# Patient Record
Sex: Female | Born: 1935 | Race: White | Hispanic: No | Smoking: Former smoker
Health system: Southern US, Community
[De-identification: ages and names within clinical notes are randomized; demographics above are authoritative.]

## PROBLEM LIST (undated history)

## (undated) DIAGNOSIS — I5022 Chronic systolic (congestive) heart failure: Secondary | ICD-10-CM

## (undated) DIAGNOSIS — E785 Hyperlipidemia, unspecified: Secondary | ICD-10-CM

## (undated) DIAGNOSIS — E079 Disorder of thyroid, unspecified: Secondary | ICD-10-CM

## (undated) DIAGNOSIS — C569 Malignant neoplasm of unspecified ovary: Secondary | ICD-10-CM

## (undated) DIAGNOSIS — I482 Chronic atrial fibrillation, unspecified: Secondary | ICD-10-CM

## (undated) DIAGNOSIS — Z8673 Personal history of transient ischemic attack (TIA), and cerebral infarction without residual deficits: Secondary | ICD-10-CM

## (undated) DIAGNOSIS — R011 Cardiac murmur, unspecified: Secondary | ICD-10-CM

## (undated) HISTORY — PX: BREAST BIOPSY: SHX20

## (undated) HISTORY — DX: Disorder of thyroid, unspecified: E07.9

## (undated) HISTORY — DX: Chronic systolic (congestive) heart failure: I50.22

## (undated) HISTORY — DX: Malignant neoplasm of unspecified ovary: C56.9

## (undated) HISTORY — DX: Hyperlipidemia, unspecified: E78.5

## (undated) HISTORY — DX: Cardiac murmur, unspecified: R01.1

## (undated) HISTORY — PX: ABDOMINAL HYSTERECTOMY: SHX81

## (undated) HISTORY — PX: APPENDECTOMY: SHX54

## (undated) HISTORY — DX: Chronic atrial fibrillation, unspecified: I48.20

## (undated) HISTORY — DX: Personal history of transient ischemic attack (TIA), and cerebral infarction without residual deficits: Z86.73

## (undated) HISTORY — PX: LAPAROSCOPY: SHX197

---

## 2008-06-27 ENCOUNTER — Ambulatory Visit: Payer: Self-pay | Admitting: Unknown Physician Specialty

## 2010-01-30 ENCOUNTER — Ambulatory Visit: Payer: Self-pay | Admitting: Gynecologic Oncology

## 2010-02-13 ENCOUNTER — Ambulatory Visit: Payer: Self-pay | Admitting: Gynecologic Oncology

## 2010-02-23 ENCOUNTER — Ambulatory Visit: Payer: Self-pay | Admitting: Gynecologic Oncology

## 2010-03-02 ENCOUNTER — Ambulatory Visit: Payer: Self-pay | Admitting: Gynecologic Oncology

## 2010-03-06 ENCOUNTER — Inpatient Hospital Stay: Payer: Self-pay | Admitting: Gynecologic Oncology

## 2010-04-01 ENCOUNTER — Ambulatory Visit: Payer: Self-pay | Admitting: Gynecologic Oncology

## 2010-04-03 ENCOUNTER — Ambulatory Visit: Payer: Self-pay | Admitting: Gynecologic Oncology

## 2010-04-09 ENCOUNTER — Ambulatory Visit: Payer: Self-pay | Admitting: General Surgery

## 2010-05-02 ENCOUNTER — Ambulatory Visit: Payer: Self-pay | Admitting: Gynecologic Oncology

## 2010-06-01 ENCOUNTER — Ambulatory Visit: Payer: Self-pay | Admitting: Gynecologic Oncology

## 2010-06-17 LAB — CA 125: CA 125: 30.7 U/mL (ref 0.0–34.0)

## 2010-07-02 ENCOUNTER — Ambulatory Visit: Payer: Self-pay | Admitting: Gynecologic Oncology

## 2010-07-08 LAB — CA 125: CA 125: 22.5 U/mL (ref 0.0–34.0)

## 2010-08-02 ENCOUNTER — Ambulatory Visit: Payer: Self-pay | Admitting: Gynecologic Oncology

## 2010-08-03 LAB — CA 125: CA 125: 21.6 U/mL (ref 0.0–34.0)

## 2010-09-01 ENCOUNTER — Ambulatory Visit: Payer: Self-pay | Admitting: Gynecologic Oncology

## 2010-10-02 ENCOUNTER — Ambulatory Visit: Payer: Self-pay | Admitting: Gynecologic Oncology

## 2010-10-22 LAB — CA 125: CA 125: 22.8 U/mL (ref 0.0–34.0)

## 2010-11-01 ENCOUNTER — Ambulatory Visit: Payer: Self-pay | Admitting: Gynecologic Oncology

## 2010-12-01 LAB — CA 125: CA 125: 34.1 U/mL — ABNORMAL HIGH (ref 0.0–34.0)

## 2010-12-02 ENCOUNTER — Ambulatory Visit: Payer: Self-pay | Admitting: Gynecologic Oncology

## 2011-01-02 ENCOUNTER — Ambulatory Visit: Payer: Self-pay | Admitting: Gynecologic Oncology

## 2011-01-31 ENCOUNTER — Ambulatory Visit: Payer: Self-pay | Admitting: Gynecologic Oncology

## 2011-02-01 LAB — CA 125: CA 125: 45.9 U/mL — ABNORMAL HIGH (ref 0.0–34.0)

## 2011-02-22 LAB — CA 125: CA 125: 63.8 U/mL — ABNORMAL HIGH (ref 0.0–34.0)

## 2011-03-03 ENCOUNTER — Ambulatory Visit: Payer: Self-pay | Admitting: Gynecologic Oncology

## 2011-03-15 LAB — CA 125: CA 125: 52.8 U/mL — ABNORMAL HIGH (ref 0.0–34.0)

## 2011-04-02 ENCOUNTER — Ambulatory Visit: Payer: Self-pay | Admitting: Gynecologic Oncology

## 2011-05-03 ENCOUNTER — Ambulatory Visit: Payer: Self-pay | Admitting: Gynecologic Oncology

## 2011-05-22 LAB — CA 125: CA 125: 138.6 U/mL — ABNORMAL HIGH (ref 0.0–34.0)

## 2011-06-02 ENCOUNTER — Ambulatory Visit: Payer: Self-pay | Admitting: Gynecologic Oncology

## 2011-07-16 ENCOUNTER — Ambulatory Visit: Payer: Self-pay | Admitting: Oncology

## 2011-07-17 LAB — CA 125: CA 125: 262 U/mL — ABNORMAL HIGH (ref 0.0–34.0)

## 2011-08-03 ENCOUNTER — Ambulatory Visit: Payer: Self-pay | Admitting: Oncology

## 2011-08-14 LAB — CA 125: CA 125: 372.9 U/mL — ABNORMAL HIGH (ref 0.0–34.0)

## 2011-09-02 ENCOUNTER — Ambulatory Visit: Payer: Self-pay | Admitting: Oncology

## 2011-09-13 LAB — CA 125: CA 125: 466.9 U/mL — ABNORMAL HIGH (ref 0.0–34.0)

## 2011-10-03 ENCOUNTER — Ambulatory Visit: Payer: Self-pay | Admitting: Oncology

## 2011-11-02 ENCOUNTER — Ambulatory Visit: Payer: Self-pay | Admitting: Oncology

## 2011-11-13 LAB — CA 125: CA 125: 487.9 U/mL — ABNORMAL HIGH (ref 0.0–34.0)

## 2011-12-03 ENCOUNTER — Ambulatory Visit: Payer: Self-pay | Admitting: Oncology

## 2011-12-10 LAB — CBC CANCER CENTER
Basophil #: 0 x10 3/mm (ref 0.0–0.1)
Basophil %: 0.8 %
HCT: 36.2 % (ref 35.0–47.0)
HGB: 12.5 g/dL (ref 12.0–16.0)
Lymphocyte %: 10.2 %
MCHC: 34.5 g/dL (ref 32.0–36.0)
Monocyte %: 12.2 %
Neutrophil #: 3.9 x10 3/mm (ref 1.4–6.5)
Neutrophil %: 74.3 %
RDW: 16.7 % — ABNORMAL HIGH (ref 11.5–14.5)
WBC: 5.4 x10 3/mm (ref 3.6–11.0)

## 2011-12-10 LAB — COMPREHENSIVE METABOLIC PANEL
Anion Gap: 9 (ref 7–16)
BUN: 12 mg/dL (ref 7–18)
Bilirubin,Total: 0.4 mg/dL (ref 0.2–1.0)
Chloride: 99 mmol/L (ref 98–107)
Co2: 26 mmol/L (ref 21–32)
Creatinine: 0.75 mg/dL (ref 0.60–1.30)
EGFR (African American): >60
Osmolality: 269 (ref 275–301)
Potassium: 3.6 mmol/L (ref 3.5–5.1)
SGPT (ALT): 22 U/L
Sodium: 134 mmol/L — ABNORMAL LOW (ref 136–145)
Total Protein: 7.3 g/dL (ref 6.4–8.2)

## 2011-12-10 LAB — MAGNESIUM: Magnesium: 1.9 mg/dL

## 2011-12-10 LAB — PHOSPHORUS: Phosphorus: 2.2 mg/dL — ABNORMAL LOW (ref 2.5–4.9)

## 2012-01-03 ENCOUNTER — Ambulatory Visit: Payer: Self-pay | Admitting: Oncology

## 2012-01-07 LAB — CBC CANCER CENTER
Basophil #: 0 x10 3/mm (ref 0.0–0.1)
Basophil %: 0.8 %
Eosinophil #: 0.1 x10 3/mm (ref 0.0–0.7)
Lymphocyte #: 0.7 x10 3/mm — ABNORMAL LOW (ref 1.0–3.6)
MCHC: 34.5 g/dL (ref 32.0–36.0)
Monocyte #: 0.6 x10 3/mm (ref 0.0–0.7)
Neutrophil #: 3.3 x10 3/mm (ref 1.4–6.5)
RDW: 16.2 % — ABNORMAL HIGH (ref 11.5–14.5)

## 2012-01-07 LAB — COMPREHENSIVE METABOLIC PANEL
Anion Gap: 8 (ref 7–16)
BUN: 12 mg/dL (ref 7–18)
Bilirubin,Total: 0.3 mg/dL (ref 0.2–1.0)
Chloride: 101 mmol/L (ref 98–107)
Creatinine: 0.88 mg/dL (ref 0.60–1.30)
EGFR (African American): >60
EGFR (Non-African Amer.): >60
Potassium: 3.8 mmol/L (ref 3.5–5.1)
SGOT(AST): 25 U/L (ref 15–37)
Total Protein: 7.5 g/dL (ref 6.4–8.2)

## 2012-01-31 ENCOUNTER — Ambulatory Visit: Payer: Self-pay | Admitting: Oncology

## 2012-02-04 LAB — CBC CANCER CENTER
Basophil #: 0 x10 3/mm (ref 0.0–0.1)
Eosinophil #: 0 x10 3/mm (ref 0.0–0.7)
Eosinophil %: 1.2 %
HCT: 36.1 % (ref 35.0–47.0)
Lymphocyte %: 16.6 %
MCH: 36 pg — ABNORMAL HIGH (ref 26.0–34.0)
MCHC: 34.5 g/dL (ref 32.0–36.0)
Neutrophil #: 2.6 x10 3/mm (ref 1.4–6.5)
Neutrophil %: 64 %
RDW: 16.3 % — ABNORMAL HIGH (ref 11.5–14.5)

## 2012-02-04 LAB — MAGNESIUM: Magnesium: 1.9 mg/dL

## 2012-02-04 LAB — COMPREHENSIVE METABOLIC PANEL
Albumin: 3.4 g/dL (ref 3.4–5.0)
Anion Gap: 8 (ref 7–16)
Bilirubin,Total: 0.4 mg/dL (ref 0.2–1.0)
Calcium, Total: 8.8 mg/dL (ref 8.5–10.1)
Creatinine: 0.69 mg/dL (ref 0.60–1.30)
EGFR (African American): >60
Glucose: 112 mg/dL — ABNORMAL HIGH (ref 65–99)
Potassium: 3.8 mmol/L (ref 3.5–5.1)
SGOT(AST): 32 U/L (ref 15–37)
SGPT (ALT): 25 U/L
Total Protein: 7.5 g/dL (ref 6.4–8.2)

## 2012-02-05 LAB — CA 125: CA 125: 337.7 U/mL — ABNORMAL HIGH (ref 0.0–34.0)

## 2012-02-14 ENCOUNTER — Ambulatory Visit: Payer: Self-pay | Admitting: Internal Medicine

## 2012-03-02 ENCOUNTER — Ambulatory Visit: Payer: Self-pay | Admitting: Oncology

## 2012-03-03 LAB — COMPREHENSIVE METABOLIC PANEL
Anion Gap: 11 (ref 7–16)
BUN: 10 mg/dL (ref 7–18)
Chloride: 99 mmol/L (ref 98–107)
Co2: 28 mmol/L (ref 21–32)
EGFR (African American): >60
Glucose: 102 mg/dL — ABNORMAL HIGH (ref 65–99)
Potassium: 3.6 mmol/L (ref 3.5–5.1)
SGPT (ALT): 24 U/L
Total Protein: 7.5 g/dL (ref 6.4–8.2)

## 2012-03-03 LAB — CBC CANCER CENTER
Basophil %: 0.5 %
Eosinophil #: 0 x10 3/mm (ref 0.0–0.7)
HGB: 12 g/dL (ref 12.0–16.0)
Lymphocyte #: 0.7 x10 3/mm — ABNORMAL LOW (ref 1.0–3.6)
Lymphocyte %: 16.8 %
MCH: 35.8 pg — ABNORMAL HIGH (ref 26.0–34.0)
MCHC: 34.4 g/dL (ref 32.0–36.0)
MCV: 104 fL — ABNORMAL HIGH (ref 80–100)
Monocyte %: 14.7 %
Platelet: 285 x10 3/mm (ref 150–440)
RBC: 3.35 10*6/uL — ABNORMAL LOW (ref 3.80–5.20)
RDW: 16.1 % — ABNORMAL HIGH (ref 11.5–14.5)
WBC: 4 x10 3/mm (ref 3.6–11.0)

## 2012-03-03 LAB — MAGNESIUM: Magnesium: 2.1 mg/dL

## 2012-03-03 LAB — PHOSPHORUS: Phosphorus: 3 mg/dL (ref 2.5–4.9)

## 2012-03-31 LAB — CBC CANCER CENTER
Basophil %: 2.4 %
Eosinophil %: 1.2 %
Lymphocyte #: 0.5 x10 3/mm — ABNORMAL LOW (ref 1.0–3.6)
Lymphocyte %: 14.6 %
MCH: 35.4 pg — ABNORMAL HIGH (ref 26.0–34.0)
MCHC: 33.4 g/dL (ref 32.0–36.0)
MCV: 106 fL — ABNORMAL HIGH (ref 80–100)
Monocyte %: 13.7 %
Neutrophil #: 2.5 x10 3/mm (ref 1.4–6.5)
Neutrophil %: 68.1 %
Platelet: 214 x10 3/mm (ref 150–440)
RDW: 15.3 % — ABNORMAL HIGH (ref 11.5–14.5)

## 2012-03-31 LAB — COMPREHENSIVE METABOLIC PANEL
BUN: 11 mg/dL (ref 7–18)
Bilirubin,Total: 0.4 mg/dL (ref 0.2–1.0)
Calcium, Total: 9.1 mg/dL (ref 8.5–10.1)
Chloride: 101 mmol/L (ref 98–107)
EGFR (Non-African Amer.): >60
Glucose: 98 mg/dL (ref 65–99)
Osmolality: 277 (ref 275–301)
SGPT (ALT): 22 U/L
Total Protein: 7.6 g/dL (ref 6.4–8.2)

## 2012-03-31 LAB — PHOSPHORUS: Phosphorus: 2.6 mg/dL (ref 2.5–4.9)

## 2012-03-31 LAB — MAGNESIUM: Magnesium: 1.9 mg/dL

## 2012-04-01 ENCOUNTER — Ambulatory Visit: Payer: Self-pay | Admitting: Oncology

## 2012-04-28 LAB — CBC CANCER CENTER
Basophil #: 0.1 x10 3/mm (ref 0.0–0.1)
Eosinophil %: 0.8 %
Lymphocyte #: 0.6 x10 3/mm — ABNORMAL LOW (ref 1.0–3.6)
Monocyte %: 15.7 %
Neutrophil %: 66.9 %
Platelet: 219 x10 3/mm (ref 150–440)
RDW: 15.5 % — ABNORMAL HIGH (ref 11.5–14.5)

## 2012-04-28 LAB — COMPREHENSIVE METABOLIC PANEL
Albumin: 3.3 g/dL — ABNORMAL LOW (ref 3.4–5.0)
Anion Gap: 9 (ref 7–16)
BUN: 11 mg/dL (ref 7–18)
Calcium, Total: 8.9 mg/dL (ref 8.5–10.1)
Chloride: 100 mmol/L (ref 98–107)
Co2: 27 mmol/L (ref 21–32)
Glucose: 110 mg/dL — ABNORMAL HIGH (ref 65–99)
SGPT (ALT): 24 U/L
Total Protein: 7.2 g/dL (ref 6.4–8.2)

## 2012-04-28 LAB — MAGNESIUM: Magnesium: 2 mg/dL

## 2012-04-28 LAB — PHOSPHORUS: Phosphorus: 2.6 mg/dL (ref 2.5–4.9)

## 2012-05-02 ENCOUNTER — Ambulatory Visit: Payer: Self-pay | Admitting: Oncology

## 2012-05-12 LAB — CBC CANCER CENTER
Basophil #: 0 x10 3/mm (ref 0.0–0.1)
Basophil %: 0.9 %
Eosinophil #: 0.1 x10 3/mm (ref 0.0–0.7)
Eosinophil %: 1.2 %
HCT: 36.2 % (ref 35.0–47.0)
HGB: 12.1 g/dL (ref 12.0–16.0)
Lymphocyte #: 0.6 x10 3/mm — ABNORMAL LOW (ref 1.0–3.6)
MCH: 35.2 pg — ABNORMAL HIGH (ref 26.0–34.0)
Monocyte #: 0.5 x10 3/mm (ref 0.2–0.9)
Monocyte %: 11 %
Neutrophil #: 3.5 x10 3/mm (ref 1.4–6.5)
Neutrophil %: 74.4 %
RBC: 3.43 10*6/uL — ABNORMAL LOW (ref 3.80–5.20)

## 2012-05-12 LAB — COMPREHENSIVE METABOLIC PANEL
Albumin: 3.4 g/dL (ref 3.4–5.0)
Alkaline Phosphatase: 118 U/L (ref 50–136)
Anion Gap: 8 (ref 7–16)
Calcium, Total: 8.9 mg/dL (ref 8.5–10.1)
Creatinine: 0.68 mg/dL (ref 0.60–1.30)
EGFR (African American): >60
EGFR (Non-African Amer.): >60
Glucose: 109 mg/dL — ABNORMAL HIGH (ref 65–99)
SGPT (ALT): 26 U/L
Sodium: 138 mmol/L (ref 136–145)

## 2012-05-13 LAB — CA 125: CA 125: 390.2 U/mL — ABNORMAL HIGH (ref 0.0–34.0)

## 2012-05-19 LAB — CBC CANCER CENTER
Eosinophil %: 3.1 %
HCT: 34.4 % — ABNORMAL LOW (ref 35.0–47.0)
Lymphocyte %: 16.5 %
MCH: 35.1 pg — ABNORMAL HIGH (ref 26.0–34.0)
MCHC: 33.2 g/dL (ref 32.0–36.0)
MCV: 106 fL — ABNORMAL HIGH (ref 80–100)
Monocyte #: 0.3 x10 3/mm (ref 0.2–0.9)
Monocyte %: 7.1 %
Neutrophil #: 3.1 x10 3/mm (ref 1.4–6.5)
Neutrophil %: 72.3 %
RBC: 3.26 10*6/uL — ABNORMAL LOW (ref 3.80–5.20)
RDW: 14 % (ref 11.5–14.5)
WBC: 4.3 x10 3/mm (ref 3.6–11.0)

## 2012-05-25 LAB — COMPREHENSIVE METABOLIC PANEL
Alkaline Phosphatase: 118 U/L (ref 50–136)
Anion Gap: 8 (ref 7–16)
Bilirubin,Total: 0.3 mg/dL (ref 0.2–1.0)
Calcium, Total: 8.9 mg/dL (ref 8.5–10.1)
Chloride: 100 mmol/L (ref 98–107)
EGFR (African American): >60
EGFR (Non-African Amer.): >60
Potassium: 4 mmol/L (ref 3.5–5.1)
SGOT(AST): 29 U/L (ref 15–37)
SGPT (ALT): 32 U/L
Sodium: 135 mmol/L — ABNORMAL LOW (ref 136–145)
Total Protein: 7.2 g/dL (ref 6.4–8.2)

## 2012-05-25 LAB — CBC CANCER CENTER
Basophil #: 0 x10 3/mm (ref 0.0–0.1)
Basophil %: 1.1 %
Eosinophil #: 0.1 x10 3/mm (ref 0.0–0.7)
Eosinophil %: 2.8 %
HCT: 31.7 % — ABNORMAL LOW (ref 35.0–47.0)
HGB: 10.6 g/dL — ABNORMAL LOW (ref 12.0–16.0)
Lymphocyte #: 0.7 x10 3/mm — ABNORMAL LOW (ref 1.0–3.6)
Lymphocyte %: 21.5 %
MCH: 35.4 pg — ABNORMAL HIGH (ref 26.0–34.0)
MCHC: 33.5 g/dL (ref 32.0–36.0)
MCV: 106 fL — ABNORMAL HIGH (ref 80–100)
Monocyte #: 0.2 x10 3/mm (ref 0.2–0.9)
Monocyte %: 6.9 %
Platelet: 107 x10 3/mm — ABNORMAL LOW (ref 150–440)
RDW: 13.7 % (ref 11.5–14.5)

## 2012-05-25 LAB — MAGNESIUM: Magnesium: 1.9 mg/dL

## 2012-05-25 LAB — PHOSPHORUS: Phosphorus: 4.2 mg/dL (ref 2.5–4.9)

## 2012-05-26 LAB — CA 125: CA 125: 303.6 U/mL — ABNORMAL HIGH (ref 0.0–34.0)

## 2012-06-01 ENCOUNTER — Ambulatory Visit: Payer: Self-pay | Admitting: Oncology

## 2012-06-09 LAB — CBC CANCER CENTER
Basophil #: 0 x10 3/mm (ref 0.0–0.1)
HCT: 31.4 % — ABNORMAL LOW (ref 35.0–47.0)
Lymphocyte #: 0.6 x10 3/mm — ABNORMAL LOW (ref 1.0–3.6)
MCH: 35.8 pg — ABNORMAL HIGH (ref 26.0–34.0)
MCHC: 33.6 g/dL (ref 32.0–36.0)
MCV: 106 fL — ABNORMAL HIGH (ref 80–100)
Monocyte %: 14.1 %
Neutrophil #: 1.8 x10 3/mm (ref 1.4–6.5)
Neutrophil %: 62.5 %
RDW: 15.3 % — ABNORMAL HIGH (ref 11.5–14.5)
WBC: 2.9 x10 3/mm — ABNORMAL LOW (ref 3.6–11.0)

## 2012-06-09 LAB — COMPREHENSIVE METABOLIC PANEL
Albumin: 3.3 g/dL — ABNORMAL LOW (ref 3.4–5.0)
Alkaline Phosphatase: 131 U/L (ref 50–136)
BUN: 15 mg/dL (ref 7–18)
Bilirubin,Total: 0.3 mg/dL (ref 0.2–1.0)
Calcium, Total: 9 mg/dL (ref 8.5–10.1)
Chloride: 100 mmol/L (ref 98–107)
Creatinine: 1.08 mg/dL (ref 0.60–1.30)
EGFR (African American): 58 — ABNORMAL LOW
Glucose: 120 mg/dL — ABNORMAL HIGH (ref 65–99)
Osmolality: 272 (ref 275–301)
Sodium: 135 mmol/L — ABNORMAL LOW (ref 136–145)
Total Protein: 7.4 g/dL (ref 6.4–8.2)

## 2012-06-16 LAB — CBC CANCER CENTER
Basophil #: 0 x10 3/mm (ref 0.0–0.1)
Eosinophil #: 0.1 x10 3/mm (ref 0.0–0.7)
HCT: 30.1 % — ABNORMAL LOW (ref 35.0–47.0)
HGB: 10.1 g/dL — ABNORMAL LOW (ref 12.0–16.0)
Lymphocyte #: 0.5 x10 3/mm — ABNORMAL LOW (ref 1.0–3.6)
Lymphocyte %: 21.1 %
MCHC: 33.5 g/dL (ref 32.0–36.0)
MCV: 106 fL — ABNORMAL HIGH (ref 80–100)
Monocyte #: 0.4 x10 3/mm (ref 0.2–0.9)
Neutrophil %: 59.3 %
RDW: 14.6 % — ABNORMAL HIGH (ref 11.5–14.5)

## 2012-06-16 LAB — BASIC METABOLIC PANEL
Anion Gap: 6 — ABNORMAL LOW (ref 7–16)
Calcium, Total: 8.8 mg/dL (ref 8.5–10.1)
EGFR (African American): >60
EGFR (Non-African Amer.): >60
Glucose: 129 mg/dL — ABNORMAL HIGH (ref 65–99)
Osmolality: 274 (ref 275–301)

## 2012-06-23 LAB — CBC CANCER CENTER
Basophil #: 0 x10 3/mm (ref 0.0–0.1)
Basophil %: 0.8 %
Eosinophil #: 0.1 x10 3/mm (ref 0.0–0.7)
Eosinophil %: 2.4 %
Lymphocyte #: 0.6 x10 3/mm — ABNORMAL LOW (ref 1.0–3.6)
MCH: 36 pg — ABNORMAL HIGH (ref 26.0–34.0)
MCHC: 33.8 g/dL (ref 32.0–36.0)
Monocyte #: 0.3 x10 3/mm (ref 0.2–0.9)
Neutrophil %: 72.3 %
Platelet: 118 x10 3/mm — ABNORMAL LOW (ref 150–440)
RBC: 2.66 10*6/uL — ABNORMAL LOW (ref 3.80–5.20)
RDW: 14.8 % — ABNORMAL HIGH (ref 11.5–14.5)

## 2012-06-23 LAB — BASIC METABOLIC PANEL
Anion Gap: 6 — ABNORMAL LOW (ref 7–16)
Calcium, Total: 8.7 mg/dL (ref 8.5–10.1)
Co2: 29 mmol/L (ref 21–32)
EGFR (African American): >60
EGFR (Non-African Amer.): >60
Potassium: 3.9 mmol/L (ref 3.5–5.1)
Sodium: 136 mmol/L (ref 136–145)

## 2012-07-02 ENCOUNTER — Ambulatory Visit: Payer: Self-pay | Admitting: Oncology

## 2012-07-07 LAB — BASIC METABOLIC PANEL
BUN: 12 mg/dL (ref 7–18)
Calcium, Total: 8.9 mg/dL (ref 8.5–10.1)
Co2: 26 mmol/L (ref 21–32)
Creatinine: 1 mg/dL (ref 0.60–1.30)
EGFR (African American): >60
Potassium: 3.7 mmol/L (ref 3.5–5.1)
Sodium: 137 mmol/L (ref 136–145)

## 2012-07-07 LAB — CBC CANCER CENTER
Basophil %: 1.1 %
Eosinophil #: 0.2 x10 3/mm (ref 0.0–0.7)
Eosinophil %: 4.6 %
HCT: 30.4 % — ABNORMAL LOW (ref 35.0–47.0)
HGB: 10.5 g/dL — ABNORMAL LOW (ref 12.0–16.0)
Lymphocyte %: 15.1 %
MCHC: 34.5 g/dL (ref 32.0–36.0)
Monocyte #: 0.4 x10 3/mm (ref 0.2–0.9)
Neutrophil #: 2.4 x10 3/mm (ref 1.4–6.5)
Neutrophil %: 67.6 %
RBC: 2.83 10*6/uL — ABNORMAL LOW (ref 3.80–5.20)
WBC: 3.5 x10 3/mm — ABNORMAL LOW (ref 3.6–11.0)

## 2012-07-08 LAB — CA 125: CA 125: 351.4 U/mL — ABNORMAL HIGH (ref 0.0–34.0)

## 2012-07-14 LAB — COMPREHENSIVE METABOLIC PANEL
Albumin: 3.3 g/dL — ABNORMAL LOW (ref 3.4–5.0)
Alkaline Phosphatase: 109 U/L (ref 50–136)
Anion Gap: 7 (ref 7–16)
BUN: 16 mg/dL (ref 7–18)
Bilirubin,Total: 0.2 mg/dL (ref 0.2–1.0)
Creatinine: 0.88 mg/dL (ref 0.60–1.30)
EGFR (Non-African Amer.): >60
Glucose: 91 mg/dL (ref 65–99)
Osmolality: 271 (ref 275–301)
Potassium: 3.9 mmol/L (ref 3.5–5.1)
Sodium: 135 mmol/L — ABNORMAL LOW (ref 136–145)
Total Protein: 7.1 g/dL (ref 6.4–8.2)

## 2012-07-14 LAB — CBC CANCER CENTER
Basophil %: 1.6 %
HCT: 28.5 % — ABNORMAL LOW (ref 35.0–47.0)
HGB: 9.5 g/dL — ABNORMAL LOW (ref 12.0–16.0)
Lymphocyte %: 18.1 %
MCHC: 33.3 g/dL (ref 32.0–36.0)
MCV: 107 fL — ABNORMAL HIGH (ref 80–100)
Monocyte %: 17.2 %
Neutrophil #: 1.8 x10 3/mm (ref 1.4–6.5)
RBC: 2.68 10*6/uL — ABNORMAL LOW (ref 3.80–5.20)
WBC: 3 x10 3/mm — ABNORMAL LOW (ref 3.6–11.0)

## 2012-07-21 LAB — CBC CANCER CENTER
Basophil #: 0 x10 3/mm (ref 0.0–0.1)
Basophil %: 0.8 %
Eosinophil #: 0.1 x10 3/mm (ref 0.0–0.7)
HGB: 9.6 g/dL — ABNORMAL LOW (ref 12.0–16.0)
Lymphocyte #: 0.6 x10 3/mm — ABNORMAL LOW (ref 1.0–3.6)
Lymphocyte %: 13 %
MCH: 35.4 pg — ABNORMAL HIGH (ref 26.0–34.0)
MCHC: 33.3 g/dL (ref 32.0–36.0)
MCV: 107 fL — ABNORMAL HIGH (ref 80–100)
Monocyte #: 0.4 x10 3/mm (ref 0.2–0.9)
Neutrophil %: 73.9 %
RDW: 16.3 % — ABNORMAL HIGH (ref 11.5–14.5)

## 2012-07-21 LAB — COMPREHENSIVE METABOLIC PANEL
Albumin: 3.3 g/dL — ABNORMAL LOW (ref 3.4–5.0)
BUN: 12 mg/dL (ref 7–18)
Bilirubin,Total: 0.3 mg/dL (ref 0.2–1.0)
Calcium, Total: 8.9 mg/dL (ref 8.5–10.1)
Chloride: 99 mmol/L (ref 98–107)
Creatinine: 0.7 mg/dL (ref 0.60–1.30)
EGFR (African American): >60
SGOT(AST): 28 U/L (ref 15–37)
SGPT (ALT): 30 U/L (ref 12–78)
Total Protein: 7.3 g/dL (ref 6.4–8.2)

## 2012-08-02 ENCOUNTER — Ambulatory Visit: Payer: Self-pay | Admitting: Oncology

## 2012-08-06 LAB — CBC CANCER CENTER
Basophil %: 1.2 %
Eosinophil %: 4.7 %
HGB: 10.8 g/dL — ABNORMAL LOW (ref 12.0–16.0)
Lymphocyte #: 0.5 x10 3/mm — ABNORMAL LOW (ref 1.0–3.6)
Lymphocyte %: 15.1 %
MCV: 109 fL — ABNORMAL HIGH (ref 80–100)
Monocyte %: 13.7 %
Neutrophil #: 2.4 x10 3/mm (ref 1.4–6.5)
Neutrophil %: 65.3 %
RBC: 3.06 10*6/uL — ABNORMAL LOW (ref 3.80–5.20)

## 2012-08-06 LAB — COMPREHENSIVE METABOLIC PANEL
Albumin: 3.3 g/dL — ABNORMAL LOW (ref 3.4–5.0)
Anion Gap: 5 — ABNORMAL LOW (ref 7–16)
BUN: 8 mg/dL (ref 7–18)
Bilirubin,Total: 0.4 mg/dL (ref 0.2–1.0)
Calcium, Total: 9.1 mg/dL (ref 8.5–10.1)
Creatinine: 0.89 mg/dL (ref 0.60–1.30)
Glucose: 106 mg/dL — ABNORMAL HIGH (ref 65–99)
Potassium: 4 mmol/L (ref 3.5–5.1)
SGOT(AST): 27 U/L (ref 15–37)
SGPT (ALT): 24 U/L (ref 12–78)
Total Protein: 7.6 g/dL (ref 6.4–8.2)

## 2012-08-18 LAB — CREATININE, SERUM: EGFR (Non-African Amer.): >60

## 2012-08-18 LAB — CBC CANCER CENTER
Basophil #: 0.1 x10 3/mm (ref 0.0–0.1)
Eosinophil #: 0.1 x10 3/mm (ref 0.0–0.7)
Eosinophil %: 3.1 %
Lymphocyte %: 12.7 %
Monocyte %: 12.5 %
Neutrophil %: 70.3 %
Platelet: 186 x10 3/mm (ref 150–440)
RBC: 3.07 10*6/uL — ABNORMAL LOW (ref 3.80–5.20)
RDW: 15.5 % — ABNORMAL HIGH (ref 11.5–14.5)
WBC: 4.5 x10 3/mm (ref 3.6–11.0)

## 2012-08-25 LAB — CBC CANCER CENTER
Basophil %: 1.1 %
Eosinophil #: 0.1 x10 3/mm (ref 0.0–0.7)
Eosinophil %: 2.5 %
HCT: 31.8 % — ABNORMAL LOW (ref 35.0–47.0)
Lymphocyte #: 0.5 x10 3/mm — ABNORMAL LOW (ref 1.0–3.6)
MCH: 34.7 pg — ABNORMAL HIGH (ref 26.0–34.0)
MCV: 106 fL — ABNORMAL HIGH (ref 80–100)
Monocyte #: 0.3 x10 3/mm (ref 0.2–0.9)
Neutrophil #: 3 x10 3/mm (ref 1.4–6.5)
Neutrophil %: 76.5 %
Platelet: 125 x10 3/mm — ABNORMAL LOW (ref 150–440)
RBC: 2.99 10*6/uL — ABNORMAL LOW (ref 3.80–5.20)
RDW: 15.1 % — ABNORMAL HIGH (ref 11.5–14.5)
WBC: 3.9 x10 3/mm (ref 3.6–11.0)

## 2012-08-25 LAB — COMPREHENSIVE METABOLIC PANEL
Albumin: 3.2 g/dL — ABNORMAL LOW (ref 3.4–5.0)
Alkaline Phosphatase: 101 U/L (ref 50–136)
Anion Gap: 7 (ref 7–16)
BUN: 8 mg/dL (ref 7–18)
Calcium, Total: 9 mg/dL (ref 8.5–10.1)
Glucose: 134 mg/dL — ABNORMAL HIGH (ref 65–99)
Potassium: 3.4 mmol/L — ABNORMAL LOW (ref 3.5–5.1)
SGOT(AST): 29 U/L (ref 15–37)
SGPT (ALT): 27 U/L (ref 12–78)
Total Protein: 7.4 g/dL (ref 6.4–8.2)

## 2012-09-01 ENCOUNTER — Ambulatory Visit: Payer: Self-pay | Admitting: Oncology

## 2012-09-08 LAB — COMPREHENSIVE METABOLIC PANEL
Alkaline Phosphatase: 98 U/L (ref 50–136)
BUN: 11 mg/dL (ref 7–18)
Bilirubin,Total: 0.4 mg/dL (ref 0.2–1.0)
Chloride: 100 mmol/L (ref 98–107)
Creatinine: 0.84 mg/dL (ref 0.60–1.30)
EGFR (African American): >60
EGFR (Non-African Amer.): >60
SGPT (ALT): 25 U/L (ref 12–78)
Sodium: 138 mmol/L (ref 136–145)
Total Protein: 7.5 g/dL (ref 6.4–8.2)

## 2012-09-08 LAB — CBC CANCER CENTER
Basophil %: 0.9 %
Eosinophil #: 0.1 x10 3/mm (ref 0.0–0.7)
HCT: 33.3 % — ABNORMAL LOW (ref 35.0–47.0)
HGB: 10.7 g/dL — ABNORMAL LOW (ref 12.0–16.0)
Lymphocyte #: 0.4 x10 3/mm — ABNORMAL LOW (ref 1.0–3.6)
Lymphocyte %: 12.5 %
MCH: 34.3 pg — ABNORMAL HIGH (ref 26.0–34.0)
MCHC: 32.3 g/dL (ref 32.0–36.0)
MCV: 106 fL — ABNORMAL HIGH (ref 80–100)
Monocyte #: 0.4 x10 3/mm (ref 0.2–0.9)
Neutrophil #: 2.5 x10 3/mm (ref 1.4–6.5)

## 2012-09-08 LAB — MAGNESIUM: Magnesium: 2 mg/dL

## 2012-09-09 LAB — CA 125: CA 125: 538.7 U/mL — ABNORMAL HIGH (ref 0.0–34.0)

## 2012-09-15 LAB — CBC CANCER CENTER
Basophil #: 0 x10 3/mm (ref 0.0–0.1)
Basophil %: 0.4 %
Eosinophil #: 0.1 x10 3/mm (ref 0.0–0.7)
HCT: 32.7 % — ABNORMAL LOW (ref 35.0–47.0)
HGB: 10.7 g/dL — ABNORMAL LOW (ref 12.0–16.0)
Lymphocyte #: 0.6 x10 3/mm — ABNORMAL LOW (ref 1.0–3.6)
Lymphocyte %: 19.2 %
MCH: 34.4 pg — ABNORMAL HIGH (ref 26.0–34.0)
MCHC: 32.7 g/dL (ref 32.0–36.0)
MCV: 105 fL — ABNORMAL HIGH (ref 80–100)
Neutrophil #: 2 x10 3/mm (ref 1.4–6.5)
Platelet: 313 x10 3/mm (ref 150–440)
RDW: 16.2 % — ABNORMAL HIGH (ref 11.5–14.5)
WBC: 3.2 x10 3/mm — ABNORMAL LOW (ref 3.6–11.0)

## 2012-09-22 LAB — COMPREHENSIVE METABOLIC PANEL
Alkaline Phosphatase: 95 U/L (ref 50–136)
BUN: 7 mg/dL (ref 7–18)
Bilirubin,Total: 0.3 mg/dL (ref 0.2–1.0)
Chloride: 101 mmol/L (ref 98–107)
Creatinine: 0.78 mg/dL (ref 0.60–1.30)
EGFR (African American): >60
Glucose: 127 mg/dL — ABNORMAL HIGH (ref 65–99)
Potassium: 3.5 mmol/L (ref 3.5–5.1)
SGPT (ALT): 25 U/L (ref 12–78)
Sodium: 137 mmol/L (ref 136–145)
Total Protein: 7.2 g/dL (ref 6.4–8.2)

## 2012-09-22 LAB — CBC CANCER CENTER
Basophil %: 3.1 %
Eosinophil #: 0.1 x10 3/mm (ref 0.0–0.7)
Eosinophil %: 1.8 %
HCT: 31.7 % — ABNORMAL LOW (ref 35.0–47.0)
HGB: 10.3 g/dL — ABNORMAL LOW (ref 12.0–16.0)
Lymphocyte %: 13.9 %
MCHC: 32.5 g/dL (ref 32.0–36.0)
Neutrophil #: 2.8 x10 3/mm (ref 1.4–6.5)
Neutrophil %: 74.4 %

## 2012-10-02 ENCOUNTER — Ambulatory Visit: Payer: Self-pay | Admitting: Oncology

## 2012-10-06 LAB — COMPREHENSIVE METABOLIC PANEL
Anion Gap: 13 (ref 7–16)
Calcium, Total: 8.8 mg/dL (ref 8.5–10.1)
Chloride: 100 mmol/L (ref 98–107)
Co2: 23 mmol/L (ref 21–32)
Creatinine: 0.78 mg/dL (ref 0.60–1.30)
EGFR (African American): >60
EGFR (Non-African Amer.): >60
Glucose: 116 mg/dL — ABNORMAL HIGH (ref 65–99)
Potassium: 3.6 mmol/L (ref 3.5–5.1)
SGOT(AST): 28 U/L (ref 15–37)
Sodium: 136 mmol/L (ref 136–145)

## 2012-10-06 LAB — CBC CANCER CENTER
Basophil #: 0.1 x10 3/mm (ref 0.0–0.1)
Basophil %: 1.3 %
Eosinophil #: 0.1 x10 3/mm (ref 0.0–0.7)
Eosinophil %: 2.9 %
HCT: 33.8 % — ABNORMAL LOW (ref 35.0–47.0)
HGB: 10.8 g/dL — ABNORMAL LOW (ref 12.0–16.0)
Lymphocyte #: 0.5 x10 3/mm — ABNORMAL LOW (ref 1.0–3.6)
Lymphocyte %: 12.1 %
MCV: 104 fL — ABNORMAL HIGH (ref 80–100)
Monocyte %: 10.2 %
Neutrophil #: 2.9 x10 3/mm (ref 1.4–6.5)
RBC: 3.26 10*6/uL — ABNORMAL LOW (ref 3.80–5.20)
WBC: 4 x10 3/mm (ref 3.6–11.0)

## 2012-10-13 LAB — COMPREHENSIVE METABOLIC PANEL
Albumin: 3.1 g/dL — ABNORMAL LOW (ref 3.4–5.0)
Alkaline Phosphatase: 106 U/L (ref 50–136)
BUN: 6 mg/dL — ABNORMAL LOW (ref 7–18)
Calcium, Total: 8.7 mg/dL (ref 8.5–10.1)
Chloride: 99 mmol/L (ref 98–107)
Co2: 26 mmol/L (ref 21–32)
Creatinine: 0.81 mg/dL (ref 0.60–1.30)
EGFR (African American): >60
EGFR (Non-African Amer.): >60
Glucose: 114 mg/dL — ABNORMAL HIGH (ref 65–99)
Potassium: 3.4 mmol/L — ABNORMAL LOW (ref 3.5–5.1)
SGPT (ALT): 28 U/L (ref 12–78)
Sodium: 137 mmol/L (ref 136–145)

## 2012-10-13 LAB — CBC CANCER CENTER
Basophil %: 1.5 %
HCT: 33.4 % — ABNORMAL LOW (ref 35.0–47.0)
HGB: 10.9 g/dL — ABNORMAL LOW (ref 12.0–16.0)
Lymphocyte #: 0.6 x10 3/mm — ABNORMAL LOW (ref 1.0–3.6)
MCH: 33.7 pg (ref 26.0–34.0)
MCHC: 32.6 g/dL (ref 32.0–36.0)
MCV: 103 fL — ABNORMAL HIGH (ref 80–100)
Monocyte %: 13.8 %
Neutrophil #: 2.6 x10 3/mm (ref 1.4–6.5)
RBC: 3.24 10*6/uL — ABNORMAL LOW (ref 3.80–5.20)
RDW: 16.9 % — ABNORMAL HIGH (ref 11.5–14.5)
WBC: 3.8 x10 3/mm (ref 3.6–11.0)

## 2012-10-20 LAB — CBC CANCER CENTER
Basophil #: 0 x10 3/mm (ref 0.0–0.1)
Eosinophil #: 0 x10 3/mm (ref 0.0–0.7)
HCT: 33.6 % — ABNORMAL LOW (ref 35.0–47.0)
Lymphocyte #: 0.3 x10 3/mm — ABNORMAL LOW (ref 1.0–3.6)
Lymphocyte %: 9.3 %
MCHC: 32.5 g/dL (ref 32.0–36.0)
Monocyte %: 6.1 %
Neutrophil #: 2.6 x10 3/mm (ref 1.4–6.5)
Platelet: 103 x10 3/mm — ABNORMAL LOW (ref 150–440)
RBC: 3.3 10*6/uL — ABNORMAL LOW (ref 3.80–5.20)
RDW: 17 % — ABNORMAL HIGH (ref 11.5–14.5)
WBC: 3.1 x10 3/mm — ABNORMAL LOW (ref 3.6–11.0)

## 2012-10-27 LAB — CBC CANCER CENTER
Basophil #: 0 x10 3/mm (ref 0.0–0.1)
Eosinophil %: 1.7 %
HGB: 9.4 g/dL — ABNORMAL LOW (ref 12.0–16.0)
MCHC: 32.8 g/dL (ref 32.0–36.0)
MCV: 102 fL — ABNORMAL HIGH (ref 80–100)
Monocyte #: 0.1 x10 3/mm — ABNORMAL LOW (ref 0.2–0.9)
Monocyte %: 6.8 %
Neutrophil %: 73 %
Platelet: 40 x10 3/mm — ABNORMAL LOW (ref 150–440)
RBC: 2.81 10*6/uL — ABNORMAL LOW (ref 3.80–5.20)
WBC: 1.9 x10 3/mm — CL (ref 3.6–11.0)

## 2012-11-01 ENCOUNTER — Ambulatory Visit: Payer: Self-pay | Admitting: Oncology

## 2012-11-03 LAB — CBC CANCER CENTER
Basophil %: 1.1 %
Eosinophil #: 0 x10 3/mm (ref 0.0–0.7)
Eosinophil %: 1.7 %
HGB: 10.4 g/dL — ABNORMAL LOW (ref 12.0–16.0)
Lymphocyte #: 0.4 x10 3/mm — ABNORMAL LOW (ref 1.0–3.6)
MCH: 33.5 pg (ref 26.0–34.0)
MCV: 101 fL — ABNORMAL HIGH (ref 80–100)
Monocyte #: 0.5 x10 3/mm (ref 0.2–0.9)
Monocyte %: 19.3 %
Neutrophil #: 1.5 x10 3/mm (ref 1.4–6.5)
RBC: 3.09 10*6/uL — ABNORMAL LOW (ref 3.80–5.20)

## 2012-11-10 LAB — CBC CANCER CENTER
Basophil #: 0.1 x10 3/mm (ref 0.0–0.1)
Basophil %: 1.9 %
Eosinophil #: 0 x10 3/mm (ref 0.0–0.7)
Lymphocyte #: 0.5 x10 3/mm — ABNORMAL LOW (ref 1.0–3.6)
MCH: 34.4 pg — ABNORMAL HIGH (ref 26.0–34.0)
MCHC: 33.8 g/dL (ref 32.0–36.0)
Monocyte #: 0.8 x10 3/mm (ref 0.2–0.9)
Neutrophil #: 2.9 x10 3/mm (ref 1.4–6.5)
Neutrophil %: 67.7 %
Platelet: 479 x10 3/mm — ABNORMAL HIGH (ref 150–440)
RBC: 3.28 10*6/uL — ABNORMAL LOW (ref 3.80–5.20)
RDW: 17.9 % — ABNORMAL HIGH (ref 11.5–14.5)

## 2012-11-10 LAB — COMPREHENSIVE METABOLIC PANEL
Albumin: 2.9 g/dL — ABNORMAL LOW (ref 3.4–5.0)
Alkaline Phosphatase: 89 U/L (ref 50–136)
BUN: 8 mg/dL (ref 7–18)
Bilirubin,Total: 0.4 mg/dL (ref 0.2–1.0)
Co2: 27 mmol/L (ref 21–32)
Creatinine: 0.83 mg/dL (ref 0.60–1.30)
EGFR (African American): >60
Glucose: 140 mg/dL — ABNORMAL HIGH (ref 65–99)
Potassium: 3.6 mmol/L (ref 3.5–5.1)
SGOT(AST): 25 U/L (ref 15–37)
Sodium: 135 mmol/L — ABNORMAL LOW (ref 136–145)
Total Protein: 7 g/dL (ref 6.4–8.2)

## 2012-11-17 LAB — COMPREHENSIVE METABOLIC PANEL
Albumin: 3 g/dL — ABNORMAL LOW (ref 3.4–5.0)
Anion Gap: 8 (ref 7–16)
BUN: 7 mg/dL (ref 7–18)
Calcium, Total: 8.4 mg/dL — ABNORMAL LOW (ref 8.5–10.1)
Chloride: 91 mmol/L — ABNORMAL LOW (ref 98–107)
EGFR (African American): >60
EGFR (Non-African Amer.): >60
Glucose: 125 mg/dL — ABNORMAL HIGH (ref 65–99)
Osmolality: 260 (ref 275–301)
SGOT(AST): 33 U/L (ref 15–37)
SGPT (ALT): 23 U/L (ref 12–78)
Total Protein: 7.1 g/dL (ref 6.4–8.2)

## 2012-11-17 LAB — CBC CANCER CENTER
Basophil #: 0.1 x10 3/mm (ref 0.0–0.1)
Eosinophil #: 0 x10 3/mm (ref 0.0–0.7)
HCT: 32.1 % — ABNORMAL LOW (ref 35.0–47.0)
HGB: 11 g/dL — ABNORMAL LOW (ref 12.0–16.0)
Lymphocyte #: 0.3 x10 3/mm — ABNORMAL LOW (ref 1.0–3.6)
Lymphocyte %: 9.8 %
MCHC: 34.1 g/dL (ref 32.0–36.0)
MCV: 99 fL (ref 80–100)
Monocyte #: 0.3 x10 3/mm (ref 0.2–0.9)
RDW: 17.1 % — ABNORMAL HIGH (ref 11.5–14.5)

## 2012-11-24 LAB — CBC CANCER CENTER
Basophil %: 1.5 %
Eosinophil #: 0 x10 3/mm (ref 0.0–0.7)
Eosinophil %: 1.4 %
HCT: 27.8 % — ABNORMAL LOW (ref 35.0–47.0)
HGB: 9.5 g/dL — ABNORMAL LOW (ref 12.0–16.0)
Lymphocyte #: 0.3 x10 3/mm — ABNORMAL LOW (ref 1.0–3.6)
MCH: 34 pg (ref 26.0–34.0)
MCV: 100 fL (ref 80–100)
Monocyte %: 11.6 %
Neutrophil #: 0.9 x10 3/mm — ABNORMAL LOW (ref 1.4–6.5)
RBC: 2.79 10*6/uL — ABNORMAL LOW (ref 3.80–5.20)
WBC: 1.4 x10 3/mm — CL (ref 3.6–11.0)

## 2012-12-01 LAB — CBC CANCER CENTER
Basophil #: 0.1 x10 3/mm (ref 0.0–0.1)
Eosinophil #: 0.1 x10 3/mm (ref 0.0–0.7)
HCT: 32.1 % — ABNORMAL LOW (ref 35.0–47.0)
HGB: 10.7 g/dL — ABNORMAL LOW (ref 12.0–16.0)
Lymphocyte #: 0.3 x10 3/mm — ABNORMAL LOW (ref 1.0–3.6)
MCH: 33.9 pg (ref 26.0–34.0)
MCHC: 33.5 g/dL (ref 32.0–36.0)
MCV: 101 fL — ABNORMAL HIGH (ref 80–100)
Monocyte #: 0.4 x10 3/mm (ref 0.2–0.9)
Monocyte %: 16.8 %
Neutrophil #: 1.5 x10 3/mm (ref 1.4–6.5)
Platelet: 379 x10 3/mm (ref 150–440)
RDW: 18.3 % — ABNORMAL HIGH (ref 11.5–14.5)
WBC: 2.3 x10 3/mm — ABNORMAL LOW (ref 3.6–11.0)

## 2012-12-02 ENCOUNTER — Ambulatory Visit: Payer: Self-pay | Admitting: Oncology

## 2012-12-03 LAB — CA 125: CA 125: 1151 U/mL — ABNORMAL HIGH (ref 0.0–34.0)

## 2012-12-08 LAB — COMPREHENSIVE METABOLIC PANEL
BUN: 11 mg/dL (ref 7–18)
Chloride: 99 mmol/L (ref 98–107)
Co2: 25 mmol/L (ref 21–32)
Creatinine: 0.69 mg/dL (ref 0.60–1.30)
EGFR (African American): >60
Glucose: 117 mg/dL — ABNORMAL HIGH (ref 65–99)
Osmolality: 269 (ref 275–301)
Potassium: 3.6 mmol/L (ref 3.5–5.1)
Sodium: 134 mmol/L — ABNORMAL LOW (ref 136–145)

## 2012-12-08 LAB — CBC CANCER CENTER
Basophil #: 0.1 x10 3/mm (ref 0.0–0.1)
Basophil %: 2.3 %
Eosinophil %: 1.6 %
Lymphocyte #: 0.4 x10 3/mm — ABNORMAL LOW (ref 1.0–3.6)
Lymphocyte %: 11.3 %
MCH: 34.1 pg — ABNORMAL HIGH (ref 26.0–34.0)
MCHC: 33.6 g/dL (ref 32.0–36.0)
MCV: 101 fL — ABNORMAL HIGH (ref 80–100)
Platelet: 338 x10 3/mm (ref 150–440)
RBC: 3.25 10*6/uL — ABNORMAL LOW (ref 3.80–5.20)

## 2012-12-15 LAB — CBC CANCER CENTER
Basophil #: 0.1 x10 3/mm (ref 0.0–0.1)
Eosinophil #: 0 x10 3/mm (ref 0.0–0.7)
Eosinophil %: 0.8 %
MCH: 33.8 pg (ref 26.0–34.0)
MCHC: 33.9 g/dL (ref 32.0–36.0)
MCV: 100 fL (ref 80–100)
Monocyte #: 0.3 x10 3/mm (ref 0.2–0.9)
Monocyte %: 8.6 %
Neutrophil #: 2.5 x10 3/mm (ref 1.4–6.5)
Platelet: 158 x10 3/mm (ref 150–440)
WBC: 3.3 x10 3/mm — ABNORMAL LOW (ref 3.6–11.0)

## 2012-12-22 LAB — CBC CANCER CENTER
Basophil %: 2.2 %
Eosinophil %: 2.5 %
HCT: 30.8 % — ABNORMAL LOW (ref 35.0–47.0)
MCH: 33.5 pg (ref 26.0–34.0)
MCV: 99 fL (ref 80–100)
Monocyte %: 12.1 %
Neutrophil #: 0.7 x10 3/mm — ABNORMAL LOW (ref 1.4–6.5)
Neutrophil %: 53.8 %
RBC: 3.11 10*6/uL — ABNORMAL LOW (ref 3.80–5.20)
RDW: 16 % — ABNORMAL HIGH (ref 11.5–14.5)

## 2012-12-29 LAB — CBC CANCER CENTER
Basophil %: 1.2 %
Eosinophil #: 0.1 x10 3/mm (ref 0.0–0.7)
Eosinophil %: 2.9 %
Lymphocyte %: 15.7 %
MCH: 34 pg (ref 26.0–34.0)
MCHC: 33.7 g/dL (ref 32.0–36.0)
MCV: 101 fL — ABNORMAL HIGH (ref 80–100)
Monocyte #: 0.5 x10 3/mm (ref 0.2–0.9)
Monocyte %: 18.8 %
Neutrophil #: 1.6 x10 3/mm (ref 1.4–6.5)
Neutrophil %: 61.4 %
Platelet: 276 x10 3/mm (ref 150–440)
RBC: 3.28 10*6/uL — ABNORMAL LOW (ref 3.80–5.20)

## 2012-12-30 LAB — CA 125: CA 125: 510.3 U/mL — ABNORMAL HIGH (ref 0.0–34.0)

## 2013-01-02 ENCOUNTER — Ambulatory Visit: Payer: Self-pay | Admitting: Oncology

## 2013-01-05 LAB — CBC CANCER CENTER
Basophil #: 0.1 x10 3/mm (ref 0.0–0.1)
Basophil %: 2.1 %
Eosinophil %: 0.7 %
HCT: 31.7 % — ABNORMAL LOW (ref 35.0–47.0)
HGB: 10.9 g/dL — ABNORMAL LOW (ref 12.0–16.0)
Lymphocyte #: 0.4 x10 3/mm — ABNORMAL LOW (ref 1.0–3.6)
MCH: 34.3 pg — ABNORMAL HIGH (ref 26.0–34.0)
MCHC: 34.5 g/dL (ref 32.0–36.0)
Monocyte #: 1 x10 3/mm — ABNORMAL HIGH (ref 0.2–0.9)
Neutrophil #: 3.1 x10 3/mm (ref 1.4–6.5)
Neutrophil %: 66.7 %
Platelet: 317 x10 3/mm (ref 150–440)
RBC: 3.19 10*6/uL — ABNORMAL LOW (ref 3.80–5.20)
RDW: 16.9 % — ABNORMAL HIGH (ref 11.5–14.5)

## 2013-01-05 LAB — COMPREHENSIVE METABOLIC PANEL
Albumin: 2.9 g/dL — ABNORMAL LOW (ref 3.4–5.0)
Alkaline Phosphatase: 82 U/L (ref 50–136)
Anion Gap: 8 (ref 7–16)
BUN: 8 mg/dL (ref 7–18)
Bilirubin,Total: 0.3 mg/dL (ref 0.2–1.0)
Calcium, Total: 8.7 mg/dL (ref 8.5–10.1)
Chloride: 92 mmol/L — ABNORMAL LOW (ref 98–107)
Co2: 29 mmol/L (ref 21–32)
Creatinine: 0.72 mg/dL (ref 0.60–1.30)
EGFR (African American): >60
EGFR (Non-African Amer.): >60
Potassium: 3.6 mmol/L (ref 3.5–5.1)
SGOT(AST): 24 U/L (ref 15–37)
SGPT (ALT): 17 U/L (ref 12–78)

## 2013-01-11 LAB — CBC CANCER CENTER
Basophil #: 0 x10 3/mm (ref 0.0–0.1)
HCT: 32.7 % — ABNORMAL LOW (ref 35.0–47.0)
HGB: 11.4 g/dL — ABNORMAL LOW (ref 12.0–16.0)
Lymphocyte #: 0.3 x10 3/mm — ABNORMAL LOW (ref 1.0–3.6)
Lymphocyte %: 12.7 %
MCH: 33.8 pg (ref 26.0–34.0)
MCHC: 34.8 g/dL (ref 32.0–36.0)
Monocyte #: 0.2 x10 3/mm (ref 0.2–0.9)
Monocyte %: 7.7 %
Neutrophil #: 1.8 x10 3/mm (ref 1.4–6.5)
Neutrophil %: 78.7 %
Platelet: 206 x10 3/mm (ref 150–440)
RDW: 15.5 % — ABNORMAL HIGH (ref 11.5–14.5)
WBC: 2.3 x10 3/mm — ABNORMAL LOW (ref 3.6–11.0)

## 2013-01-19 LAB — CBC CANCER CENTER
Basophil #: 0 x10 3/mm (ref 0.0–0.1)
Basophil %: 0.7 %
Eosinophil #: 0 x10 3/mm (ref 0.0–0.7)
Eosinophil %: 2.3 %
HCT: 30.3 % — ABNORMAL LOW (ref 35.0–47.0)
HGB: 10.4 g/dL — ABNORMAL LOW (ref 12.0–16.0)
Lymphocyte #: 0.4 x10 3/mm — ABNORMAL LOW (ref 1.0–3.6)
Lymphocyte %: 21.1 %
MCH: 33.6 pg (ref 26.0–34.0)
MCHC: 34.4 g/dL (ref 32.0–36.0)
MCV: 98 fL (ref 80–100)
Monocyte #: 0.2 x10 3/mm (ref 0.2–0.9)
Monocyte %: 9.6 %
Neutrophil #: 1.2 x10 3/mm — ABNORMAL LOW (ref 1.4–6.5)
Neutrophil %: 66.3 %
Platelet: 47 x10 3/mm — ABNORMAL LOW (ref 150–440)
RBC: 3.1 10*6/uL — ABNORMAL LOW (ref 3.80–5.20)
RDW: 15.8 % — ABNORMAL HIGH (ref 11.5–14.5)
WBC: 1.9 x10 3/mm — CL (ref 3.6–11.0)

## 2013-01-26 LAB — CBC CANCER CENTER
Eosinophil #: 0.1 x10 3/mm (ref 0.0–0.7)
HCT: 31.9 % — ABNORMAL LOW (ref 35.0–47.0)
Lymphocyte %: 17.7 %
MCH: 34.2 pg — ABNORMAL HIGH (ref 26.0–34.0)
MCHC: 34.3 g/dL (ref 32.0–36.0)
MCV: 100 fL (ref 80–100)
Monocyte #: 0.4 x10 3/mm (ref 0.2–0.9)
Monocyte %: 19.7 %
Neutrophil #: 1.2 x10 3/mm — ABNORMAL LOW (ref 1.4–6.5)
Neutrophil %: 57.4 %
RDW: 18 % — ABNORMAL HIGH (ref 11.5–14.5)
WBC: 2.1 x10 3/mm — ABNORMAL LOW (ref 3.6–11.0)

## 2013-01-27 LAB — CA 125: CA 125: 409.1 U/mL — ABNORMAL HIGH (ref 0.0–34.0)

## 2013-01-30 ENCOUNTER — Ambulatory Visit: Payer: Self-pay | Admitting: Oncology

## 2013-02-02 LAB — CBC CANCER CENTER
Basophil #: 0.1 x10 3/mm (ref 0.0–0.1)
Basophil %: 2.3 %
Eosinophil #: 0.1 x10 3/mm (ref 0.0–0.7)
HGB: 11.3 g/dL — ABNORMAL LOW (ref 12.0–16.0)
Lymphocyte #: 0.6 x10 3/mm — ABNORMAL LOW (ref 1.0–3.6)
Lymphocyte %: 16.7 %
MCH: 34.5 pg — ABNORMAL HIGH (ref 26.0–34.0)
MCHC: 34.2 g/dL (ref 32.0–36.0)
MCV: 101 fL — ABNORMAL HIGH (ref 80–100)
Monocyte %: 16.8 %
RBC: 3.29 10*6/uL — ABNORMAL LOW (ref 3.80–5.20)
RDW: 18.3 % — ABNORMAL HIGH (ref 11.5–14.5)
WBC: 3.4 x10 3/mm — ABNORMAL LOW (ref 3.6–11.0)

## 2013-02-02 LAB — COMPREHENSIVE METABOLIC PANEL
Albumin: 3.2 g/dL — ABNORMAL LOW (ref 3.4–5.0)
Alkaline Phosphatase: 94 U/L (ref 50–136)
BUN: 13 mg/dL (ref 7–18)
Calcium, Total: 8.6 mg/dL (ref 8.5–10.1)
Co2: 29 mmol/L (ref 21–32)
Creatinine: 1.01 mg/dL (ref 0.60–1.30)
EGFR (Non-African Amer.): 54 — ABNORMAL LOW
Osmolality: 275 (ref 275–301)
Potassium: 3.7 mmol/L (ref 3.5–5.1)
Sodium: 136 mmol/L (ref 136–145)
Total Protein: 7.6 g/dL (ref 6.4–8.2)

## 2013-02-09 LAB — CBC CANCER CENTER
Basophil %: 2.9 %
Eosinophil #: 0 x10 3/mm (ref 0.0–0.7)
Eosinophil %: 1.6 %
HGB: 10.5 g/dL — ABNORMAL LOW (ref 12.0–16.0)
Lymphocyte #: 0.3 x10 3/mm — ABNORMAL LOW (ref 1.0–3.6)
Lymphocyte %: 18.6 %
MCH: 34.5 pg — ABNORMAL HIGH (ref 26.0–34.0)
MCHC: 34.5 g/dL (ref 32.0–36.0)
MCV: 100 fL (ref 80–100)
Neutrophil #: 1.1 x10 3/mm — ABNORMAL LOW (ref 1.4–6.5)
Neutrophil %: 62 %
RDW: 16.8 % — ABNORMAL HIGH (ref 11.5–14.5)

## 2013-02-16 LAB — CBC CANCER CENTER
Basophil %: 1.2 %
HCT: 30.2 % — ABNORMAL LOW (ref 35.0–47.0)
HGB: 10.2 g/dL — ABNORMAL LOW (ref 12.0–16.0)
Lymphocyte #: 0.4 x10 3/mm — ABNORMAL LOW (ref 1.0–3.6)
MCH: 34.1 pg — ABNORMAL HIGH (ref 26.0–34.0)
MCHC: 33.9 g/dL (ref 32.0–36.0)
MCV: 101 fL — ABNORMAL HIGH (ref 80–100)
Monocyte #: 0.2 x10 3/mm (ref 0.2–0.9)
Monocyte %: 10.5 %
Platelet: 46 x10 3/mm — ABNORMAL LOW (ref 150–440)
RBC: 3.01 10*6/uL — ABNORMAL LOW (ref 3.80–5.20)

## 2013-02-23 LAB — CBC CANCER CENTER
Basophil #: 0 x10 3/mm (ref 0.0–0.1)
Basophil %: 1 %
Eosinophil %: 2.1 %
HGB: 10.5 g/dL — ABNORMAL LOW (ref 12.0–16.0)
Lymphocyte #: 0.3 x10 3/mm — ABNORMAL LOW (ref 1.0–3.6)
MCH: 35 pg — ABNORMAL HIGH (ref 26.0–34.0)
MCHC: 34.6 g/dL (ref 32.0–36.0)
MCV: 101 fL — ABNORMAL HIGH (ref 80–100)
Monocyte #: 0.6 x10 3/mm (ref 0.2–0.9)
Monocyte %: 21.5 %
RBC: 3.01 10*6/uL — ABNORMAL LOW (ref 3.80–5.20)
RDW: 17.7 % — ABNORMAL HIGH (ref 11.5–14.5)
WBC: 2.9 x10 3/mm — ABNORMAL LOW (ref 3.6–11.0)

## 2013-02-24 ENCOUNTER — Ambulatory Visit: Payer: Self-pay | Admitting: Internal Medicine

## 2013-02-24 LAB — CA 125: CA 125: 345.1 U/mL — ABNORMAL HIGH (ref 0.0–34.0)

## 2013-03-02 ENCOUNTER — Ambulatory Visit: Payer: Self-pay | Admitting: Oncology

## 2013-03-02 LAB — COMPREHENSIVE METABOLIC PANEL
Albumin: 3.1 g/dL — ABNORMAL LOW (ref 3.4–5.0)
Alkaline Phosphatase: 96 U/L (ref 50–136)
BUN: 13 mg/dL (ref 7–18)
Bilirubin,Total: 0.4 mg/dL (ref 0.2–1.0)
Calcium, Total: 8.8 mg/dL (ref 8.5–10.1)
Chloride: 96 mmol/L — ABNORMAL LOW (ref 98–107)
Co2: 30 mmol/L (ref 21–32)
Creatinine: 0.9 mg/dL (ref 0.60–1.30)
EGFR (African American): >60
EGFR (Non-African Amer.): >60
Glucose: 129 mg/dL — ABNORMAL HIGH (ref 65–99)
Osmolality: 268 (ref 275–301)
Potassium: 3.8 mmol/L (ref 3.5–5.1)
SGOT(AST): 26 U/L (ref 15–37)
SGPT (ALT): 19 U/L (ref 12–78)
Total Protein: 7.9 g/dL (ref 6.4–8.2)

## 2013-03-02 LAB — CBC CANCER CENTER
Basophil %: 1.8 %
Eosinophil %: 1.9 %
Lymphocyte #: 0.5 x10 3/mm — ABNORMAL LOW (ref 1.0–3.6)
Lymphocyte %: 11.7 %
MCH: 34.8 pg — ABNORMAL HIGH (ref 26.0–34.0)
MCV: 102 fL — ABNORMAL HIGH (ref 80–100)
Monocyte %: 19.3 %
Neutrophil %: 65.3 %
RBC: 3.14 10*6/uL — ABNORMAL LOW (ref 3.80–5.20)
WBC: 3.9 x10 3/mm (ref 3.6–11.0)

## 2013-03-02 LAB — MAGNESIUM: Magnesium: 1.7 mg/dL — ABNORMAL LOW

## 2013-03-09 LAB — COMPREHENSIVE METABOLIC PANEL
Albumin: 3.1 g/dL — ABNORMAL LOW (ref 3.4–5.0)
Bilirubin,Total: 0.3 mg/dL (ref 0.2–1.0)
Calcium, Total: 8.8 mg/dL (ref 8.5–10.1)
Chloride: 90 mmol/L — ABNORMAL LOW (ref 98–107)
Co2: 30 mmol/L (ref 21–32)
Creatinine: 0.88 mg/dL (ref 0.60–1.30)
EGFR (African American): >60
EGFR (Non-African Amer.): >60
Glucose: 131 mg/dL — ABNORMAL HIGH (ref 65–99)
Osmolality: 259 (ref 275–301)
Potassium: 3.6 mmol/L (ref 3.5–5.1)
SGOT(AST): 38 U/L — ABNORMAL HIGH (ref 15–37)
SGPT (ALT): 22 U/L (ref 12–78)
Sodium: 128 mmol/L — ABNORMAL LOW (ref 136–145)
Total Protein: 7.8 g/dL (ref 6.4–8.2)

## 2013-03-09 LAB — CBC CANCER CENTER
Basophil #: 0.1 x10 3/mm (ref 0.0–0.1)
Eosinophil #: 0 x10 3/mm (ref 0.0–0.7)
HGB: 10.5 g/dL — ABNORMAL LOW (ref 12.0–16.0)
Lymphocyte %: 16.1 %
MCH: 34.2 pg — ABNORMAL HIGH (ref 26.0–34.0)
Monocyte #: 0.3 x10 3/mm (ref 0.2–0.9)
Neutrophil #: 1.6 x10 3/mm (ref 1.4–6.5)
Neutrophil %: 69.9 %
Platelet: 136 x10 3/mm — ABNORMAL LOW (ref 150–440)
RBC: 3.06 10*6/uL — ABNORMAL LOW (ref 3.80–5.20)
WBC: 2.3 x10 3/mm — ABNORMAL LOW (ref 3.6–11.0)

## 2013-03-16 LAB — CBC CANCER CENTER
Basophil %: 1.6 %
Eosinophil #: 0 x10 3/mm (ref 0.0–0.7)
HGB: 9.4 g/dL — ABNORMAL LOW (ref 12.0–16.0)
MCH: 34.2 pg — ABNORMAL HIGH (ref 26.0–34.0)
MCHC: 34.2 g/dL (ref 32.0–36.0)
Monocyte %: 8.6 %
Neutrophil #: 0.9 x10 3/mm — ABNORMAL LOW (ref 1.4–6.5)
Neutrophil %: 63.5 %
RBC: 2.75 10*6/uL — ABNORMAL LOW (ref 3.80–5.20)
RDW: 15.3 % — ABNORMAL HIGH (ref 11.5–14.5)

## 2013-03-23 LAB — CBC CANCER CENTER
Eosinophil #: 0.1 x10 3/mm (ref 0.0–0.7)
Eosinophil %: 2 %
HCT: 29 % — ABNORMAL LOW (ref 35.0–47.0)
HGB: 9.9 g/dL — ABNORMAL LOW (ref 12.0–16.0)
MCH: 35 pg — ABNORMAL HIGH (ref 26.0–34.0)
MCV: 102 fL — ABNORMAL HIGH (ref 80–100)
Monocyte #: 0.6 x10 3/mm (ref 0.2–0.9)
Neutrophil #: 1.5 x10 3/mm (ref 1.4–6.5)
Platelet: 236 x10 3/mm (ref 150–440)
RBC: 2.83 10*6/uL — ABNORMAL LOW (ref 3.80–5.20)

## 2013-03-24 LAB — CA 125: CA 125: 424.8 U/mL — ABNORMAL HIGH (ref 0.0–34.0)

## 2013-03-30 LAB — COMPREHENSIVE METABOLIC PANEL
Anion Gap: 6 — ABNORMAL LOW (ref 7–16)
Calcium, Total: 8.8 mg/dL (ref 8.5–10.1)
Co2: 30 mmol/L (ref 21–32)
Creatinine: 0.69 mg/dL (ref 0.60–1.30)
EGFR (Non-African Amer.): >60
Glucose: 138 mg/dL — ABNORMAL HIGH (ref 65–99)
Osmolality: 250 (ref 275–301)
Potassium: 3.3 mmol/L — ABNORMAL LOW (ref 3.5–5.1)
SGOT(AST): 54 U/L — ABNORMAL HIGH (ref 15–37)
SGPT (ALT): 32 U/L (ref 12–78)
Sodium: 124 mmol/L — ABNORMAL LOW (ref 136–145)
Total Protein: 7.6 g/dL (ref 6.4–8.2)

## 2013-03-30 LAB — CBC CANCER CENTER
Basophil #: 0.1 x10 3/mm (ref 0.0–0.1)
Eosinophil #: 0.1 x10 3/mm (ref 0.0–0.7)
Eosinophil %: 1.4 %
HGB: 10.8 g/dL — ABNORMAL LOW (ref 12.0–16.0)
Lymphocyte #: 0.4 x10 3/mm — ABNORMAL LOW (ref 1.0–3.6)
MCH: 34.4 pg — ABNORMAL HIGH (ref 26.0–34.0)
Monocyte #: 0.9 x10 3/mm (ref 0.2–0.9)
Neutrophil %: 63.6 %
Platelet: 324 x10 3/mm (ref 150–440)

## 2013-04-01 ENCOUNTER — Ambulatory Visit: Payer: Self-pay | Admitting: Oncology

## 2013-04-06 LAB — CBC CANCER CENTER
Basophil #: 0 x10 3/mm (ref 0.0–0.1)
Eosinophil #: 0 x10 3/mm (ref 0.0–0.7)
HGB: 10.8 g/dL — ABNORMAL LOW (ref 12.0–16.0)
Lymphocyte %: 10.2 %
MCH: 34.8 pg — ABNORMAL HIGH (ref 26.0–34.0)
MCHC: 34.6 g/dL (ref 32.0–36.0)
MCV: 101 fL — ABNORMAL HIGH (ref 80–100)
Monocyte %: 8.6 %
Neutrophil #: 2.3 x10 3/mm (ref 1.4–6.5)
Platelet: 163 x10 3/mm (ref 150–440)
RBC: 3.1 10*6/uL — ABNORMAL LOW (ref 3.80–5.20)
RDW: 15.7 % — ABNORMAL HIGH (ref 11.5–14.5)

## 2013-04-13 LAB — CBC CANCER CENTER
Basophil #: 0 x10 3/mm (ref 0.0–0.1)
Basophil %: 1.2 %
Eosinophil %: 0.4 %
HCT: 27 % — ABNORMAL LOW (ref 35.0–47.0)
HGB: 9.4 g/dL — ABNORMAL LOW (ref 12.0–16.0)
Lymphocyte #: 0.3 x10 3/mm — ABNORMAL LOW (ref 1.0–3.6)
Lymphocyte %: 13.7 %
MCV: 101 fL — ABNORMAL HIGH (ref 80–100)
Neutrophil #: 1.5 x10 3/mm (ref 1.4–6.5)
Neutrophil %: 77.7 %
Platelet: 36 x10 3/mm — ABNORMAL LOW (ref 150–440)
RDW: 15.1 % — ABNORMAL HIGH (ref 11.5–14.5)
WBC: 1.9 x10 3/mm — CL (ref 3.6–11.0)

## 2013-04-20 LAB — CBC CANCER CENTER
Basophil #: 0 x10 3/mm (ref 0.0–0.1)
Eosinophil %: 2.2 %
Lymphocyte #: 0.2 x10 3/mm — ABNORMAL LOW (ref 1.0–3.6)
MCV: 101 fL — ABNORMAL HIGH (ref 80–100)
Monocyte #: 0.5 x10 3/mm (ref 0.2–0.9)
Monocyte %: 24.5 %
Neutrophil #: 1.3 x10 3/mm — ABNORMAL LOW (ref 1.4–6.5)

## 2013-04-27 LAB — COMPREHENSIVE METABOLIC PANEL
Alkaline Phosphatase: 88 U/L (ref 50–136)
Bilirubin,Total: 0.2 mg/dL (ref 0.2–1.0)
Chloride: 89 mmol/L — ABNORMAL LOW (ref 98–107)
Co2: 30 mmol/L (ref 21–32)
Creatinine: 0.88 mg/dL (ref 0.60–1.30)
EGFR (African American): >60
EGFR (Non-African Amer.): >60
Osmolality: 255 (ref 275–301)
Potassium: 3.6 mmol/L (ref 3.5–5.1)
SGOT(AST): 35 U/L (ref 15–37)
Total Protein: 6.9 g/dL (ref 6.4–8.2)

## 2013-04-27 LAB — CBC CANCER CENTER
Basophil #: 0 x10 3/mm (ref 0.0–0.1)
Basophil %: 0.9 %
HGB: 10.2 g/dL — ABNORMAL LOW (ref 12.0–16.0)
Lymphocyte #: 0.2 x10 3/mm — ABNORMAL LOW (ref 1.0–3.6)
MCH: 34.8 pg — ABNORMAL HIGH (ref 26.0–34.0)
MCHC: 34.9 g/dL (ref 32.0–36.0)
Monocyte #: 0.7 x10 3/mm (ref 0.2–0.9)
Neutrophil #: 3.5 x10 3/mm (ref 1.4–6.5)
RBC: 2.92 10*6/uL — ABNORMAL LOW (ref 3.80–5.20)
WBC: 4.5 x10 3/mm (ref 3.6–11.0)

## 2013-04-29 ENCOUNTER — Ambulatory Visit: Payer: Self-pay | Admitting: Internal Medicine

## 2013-04-29 ENCOUNTER — Inpatient Hospital Stay: Payer: Self-pay | Admitting: Oncology

## 2013-04-29 LAB — CBC CANCER CENTER
Basophil #: 0.1 x10 3/mm (ref 0.0–0.1)
Basophil %: 0.8 %
Eosinophil #: 0 x10 3/mm (ref 0.0–0.7)
Lymphocyte %: 6.6 %
MCH: 34.3 pg — ABNORMAL HIGH (ref 26.0–34.0)
MCV: 100 fL (ref 80–100)
Monocyte #: 0.8 x10 3/mm (ref 0.2–0.9)
Neutrophil #: 5.2 x10 3/mm (ref 1.4–6.5)
Neutrophil %: 80.4 %
Platelet: 382 x10 3/mm (ref 150–440)
WBC: 6.5 x10 3/mm (ref 3.6–11.0)

## 2013-04-29 LAB — COMPREHENSIVE METABOLIC PANEL
Albumin: 2.7 g/dL — ABNORMAL LOW (ref 3.4–5.0)
Alkaline Phosphatase: 186 U/L — ABNORMAL HIGH (ref 50–136)
Anion Gap: 8 (ref 7–16)
BUN: 5 mg/dL — ABNORMAL LOW (ref 7–18)
Bilirubin,Total: 0.5 mg/dL (ref 0.2–1.0)
Chloride: 87 mmol/L — ABNORMAL LOW (ref 98–107)
Co2: 27 mmol/L (ref 21–32)
Osmolality: 244 (ref 275–301)
Potassium: 3.4 mmol/L — ABNORMAL LOW (ref 3.5–5.1)
SGOT(AST): 105 U/L — ABNORMAL HIGH (ref 15–37)
SGPT (ALT): 41 U/L (ref 12–78)
Sodium: 122 mmol/L — ABNORMAL LOW (ref 136–145)

## 2013-04-29 LAB — URINALYSIS, COMPLETE
Bilirubin,UR: NEGATIVE
Blood: NEGATIVE
Glucose,UR: NEGATIVE mg/dL (ref 0–75)
Ketone: NEGATIVE
Leukocyte Esterase: NEGATIVE
RBC,UR: NONE SEEN /HPF (ref 0–5)
Specific Gravity: 1.003 (ref 1.003–1.030)
WBC UR: 1 /HPF (ref 0–5)

## 2013-04-29 LAB — PROTIME-INR: INR: 1.1

## 2013-04-30 DIAGNOSIS — R180 Malignant ascites: Secondary | ICD-10-CM

## 2013-04-30 DIAGNOSIS — K432 Incisional hernia without obstruction or gangrene: Secondary | ICD-10-CM

## 2013-04-30 DIAGNOSIS — K565 Intestinal adhesions [bands], unspecified as to partial versus complete obstruction: Secondary | ICD-10-CM

## 2013-04-30 LAB — BASIC METABOLIC PANEL
Anion Gap: 4 — ABNORMAL LOW (ref 7–16)
BUN: 4 mg/dL — ABNORMAL LOW (ref 7–18)
Chloride: 97 mmol/L — ABNORMAL LOW (ref 98–107)
Creatinine: 0.62 mg/dL (ref 0.60–1.30)
EGFR (African American): >60
EGFR (Non-African Amer.): >60
Glucose: 86 mg/dL (ref 65–99)
Osmolality: 255 (ref 275–301)
Sodium: 129 mmol/L — ABNORMAL LOW (ref 136–145)

## 2013-04-30 LAB — CBC WITH DIFFERENTIAL/PLATELET
Basophil #: 0 10*3/uL (ref 0.0–0.1)
Basophil %: 0.9 %
HCT: 27.1 % — ABNORMAL LOW (ref 35.0–47.0)
MCH: 35.3 pg — ABNORMAL HIGH (ref 26.0–34.0)
MCHC: 35.5 g/dL (ref 32.0–36.0)
Monocyte #: 0.6 x10 3/mm (ref 0.2–0.9)
Monocyte %: 12.1 %
Neutrophil %: 81.1 %
RBC: 2.73 10*6/uL — ABNORMAL LOW (ref 3.80–5.20)
RDW: 15.9 % — ABNORMAL HIGH (ref 11.5–14.5)
WBC: 5 10*3/uL (ref 3.6–11.0)

## 2013-05-01 LAB — BASIC METABOLIC PANEL
Anion Gap: 7 (ref 7–16)
Calcium, Total: 8 mg/dL — ABNORMAL LOW (ref 8.5–10.1)
Co2: 25 mmol/L (ref 21–32)
Creatinine: 0.52 mg/dL — ABNORMAL LOW (ref 0.60–1.30)
EGFR (African American): >60
Osmolality: 256 (ref 275–301)
Potassium: 3.5 mmol/L (ref 3.5–5.1)
Sodium: 130 mmol/L — ABNORMAL LOW (ref 136–145)

## 2013-05-01 LAB — URINE CULTURE

## 2013-05-02 ENCOUNTER — Ambulatory Visit: Payer: Self-pay | Admitting: Oncology

## 2013-05-02 LAB — CBC WITH DIFFERENTIAL/PLATELET
Basophil #: 0.1 10*3/uL (ref 0.0–0.1)
Basophil %: 0.9 %
Eosinophil #: 0 10*3/uL (ref 0.0–0.7)
Lymphocyte #: 0.4 10*3/uL — ABNORMAL LOW (ref 1.0–3.6)
MCV: 100 fL (ref 80–100)
Monocyte %: 11.2 %
Neutrophil %: 81.2 %
Platelet: 322 10*3/uL (ref 150–440)
RBC: 2.96 10*6/uL — ABNORMAL LOW (ref 3.80–5.20)
RDW: 15.7 % — ABNORMAL HIGH (ref 11.5–14.5)
WBC: 6.6 10*3/uL (ref 3.6–11.0)

## 2013-05-02 LAB — BASIC METABOLIC PANEL
Anion Gap: 7 (ref 7–16)
Calcium, Total: 8.1 mg/dL — ABNORMAL LOW (ref 8.5–10.1)
Creatinine: 0.55 mg/dL — ABNORMAL LOW (ref 0.60–1.30)
EGFR (African American): >60
EGFR (Non-African Amer.): >60
Potassium: 3.6 mmol/L (ref 3.5–5.1)
Sodium: 131 mmol/L — ABNORMAL LOW (ref 136–145)

## 2013-05-03 LAB — BASIC METABOLIC PANEL
Anion Gap: 7 (ref 7–16)
BUN: 3 mg/dL — ABNORMAL LOW (ref 7–18)
Calcium, Total: 7.9 mg/dL — ABNORMAL LOW (ref 8.5–10.1)
Co2: 24 mmol/L (ref 21–32)
Creatinine: 0.58 mg/dL — ABNORMAL LOW (ref 0.60–1.30)
EGFR (African American): >60
EGFR (Non-African Amer.): >60
Glucose: 144 mg/dL — ABNORMAL HIGH (ref 65–99)
Osmolality: 262 (ref 275–301)
Sodium: 131 mmol/L — ABNORMAL LOW (ref 136–145)

## 2013-05-04 DIAGNOSIS — Z01810 Encounter for preprocedural cardiovascular examination: Secondary | ICD-10-CM

## 2013-05-04 LAB — BASIC METABOLIC PANEL
Chloride: 100 mmol/L (ref 98–107)
Co2: 24 mmol/L (ref 21–32)
Creatinine: 0.66 mg/dL (ref 0.60–1.30)
EGFR (Non-African Amer.): >60
Osmolality: 256 (ref 275–301)
Sodium: 129 mmol/L — ABNORMAL LOW (ref 136–145)

## 2013-05-04 LAB — MAGNESIUM: Magnesium: 1.5 mg/dL — ABNORMAL LOW

## 2013-05-05 DIAGNOSIS — I4891 Unspecified atrial fibrillation: Secondary | ICD-10-CM

## 2013-05-05 DIAGNOSIS — I10 Essential (primary) hypertension: Secondary | ICD-10-CM

## 2013-05-05 DIAGNOSIS — R9431 Abnormal electrocardiogram [ECG] [EKG]: Secondary | ICD-10-CM

## 2013-05-05 LAB — CULTURE, BLOOD (SINGLE)

## 2013-05-06 LAB — BASIC METABOLIC PANEL
Anion Gap: 7 (ref 7–16)
Chloride: 98 mmol/L (ref 98–107)
Creatinine: 0.73 mg/dL (ref 0.60–1.30)
EGFR (African American): >60
Osmolality: 258 (ref 275–301)
Potassium: 3.7 mmol/L (ref 3.5–5.1)
Sodium: 130 mmol/L — ABNORMAL LOW (ref 136–145)

## 2013-05-06 LAB — CBC WITH DIFFERENTIAL/PLATELET
Basophil %: 0.7 %
Eosinophil #: 0.1 10*3/uL (ref 0.0–0.7)
Eosinophil %: 1.6 %
HCT: 29.3 % — ABNORMAL LOW (ref 35.0–47.0)
HGB: 10 g/dL — ABNORMAL LOW (ref 12.0–16.0)
Lymphocyte #: 0.4 10*3/uL — ABNORMAL LOW (ref 1.0–3.6)
MCH: 34.2 pg — ABNORMAL HIGH (ref 26.0–34.0)
MCHC: 34.3 g/dL (ref 32.0–36.0)
MCV: 100 fL (ref 80–100)
Monocyte #: 0.7 x10 3/mm (ref 0.2–0.9)
Neutrophil #: 6.4 10*3/uL (ref 1.4–6.5)
Neutrophil %: 83.3 %
RBC: 2.93 10*6/uL — ABNORMAL LOW (ref 3.80–5.20)
WBC: 7.6 10*3/uL (ref 3.6–11.0)

## 2013-05-07 LAB — BASIC METABOLIC PANEL
Chloride: 97 mmol/L — ABNORMAL LOW (ref 98–107)
Creatinine: 0.53 mg/dL — ABNORMAL LOW (ref 0.60–1.30)
EGFR (African American): >60
EGFR (Non-African Amer.): >60

## 2013-05-07 LAB — CBC WITH DIFFERENTIAL/PLATELET
Basophil %: 0.6 %
Eosinophil #: 0.1 10*3/uL (ref 0.0–0.7)
HGB: 10.2 g/dL — ABNORMAL LOW (ref 12.0–16.0)
MCV: 100 fL (ref 80–100)
Monocyte #: 0.8 x10 3/mm (ref 0.2–0.9)
Monocyte %: 9.3 %
Neutrophil #: 7.2 10*3/uL — ABNORMAL HIGH (ref 1.4–6.5)
Neutrophil %: 84.7 %
WBC: 8.5 10*3/uL (ref 3.6–11.0)

## 2013-05-07 LAB — MAGNESIUM: Magnesium: 0.8 mg/dL — ABNORMAL LOW

## 2013-05-08 LAB — COMPREHENSIVE METABOLIC PANEL
Alkaline Phosphatase: 111 U/L (ref 50–136)
Anion Gap: 6 — ABNORMAL LOW (ref 7–16)
BUN: 6 mg/dL — ABNORMAL LOW (ref 7–18)
Bilirubin,Total: 0.4 mg/dL (ref 0.2–1.0)
Calcium, Total: 7.8 mg/dL — ABNORMAL LOW (ref 8.5–10.1)
Chloride: 94 mmol/L — ABNORMAL LOW (ref 98–107)
Creatinine: 0.72 mg/dL (ref 0.60–1.30)
Glucose: 114 mg/dL — ABNORMAL HIGH (ref 65–99)
SGPT (ALT): 11 U/L — ABNORMAL LOW (ref 12–78)

## 2013-05-08 LAB — CBC WITH DIFFERENTIAL/PLATELET
Basophil #: 0 10*3/uL (ref 0.0–0.1)
Eosinophil #: 0 10*3/uL (ref 0.0–0.7)
Eosinophil %: 0.1 %
HCT: 31.1 % — ABNORMAL LOW (ref 35.0–47.0)
Lymphocyte #: 0.4 10*3/uL — ABNORMAL LOW (ref 1.0–3.6)
Lymphocyte %: 3.9 %
MCH: 34.4 pg — ABNORMAL HIGH (ref 26.0–34.0)
MCV: 100 fL (ref 80–100)
Monocyte #: 0.6 x10 3/mm (ref 0.2–0.9)
Monocyte %: 6.4 %
Neutrophil %: 89.4 %

## 2013-05-09 LAB — CBC WITH DIFFERENTIAL/PLATELET
Basophil #: 0.1 10*3/uL (ref 0.0–0.1)
Basophil %: 0.9 %
Eosinophil #: 0.1 10*3/uL (ref 0.0–0.7)
HCT: 29.3 % — ABNORMAL LOW (ref 35.0–47.0)
Lymphocyte %: 6.6 %
MCH: 34.5 pg — ABNORMAL HIGH (ref 26.0–34.0)
MCHC: 35 g/dL (ref 32.0–36.0)
MCV: 99 fL (ref 80–100)
Neutrophil #: 5.6 10*3/uL (ref 1.4–6.5)
RBC: 2.97 10*6/uL — ABNORMAL LOW (ref 3.80–5.20)
RDW: 15.7 % — ABNORMAL HIGH (ref 11.5–14.5)

## 2013-05-09 LAB — COMPREHENSIVE METABOLIC PANEL
Albumin: 1.5 g/dL — ABNORMAL LOW (ref 3.4–5.0)
Alkaline Phosphatase: 101 U/L (ref 50–136)
BUN: 9 mg/dL (ref 7–18)
Calcium, Total: 7.6 mg/dL — ABNORMAL LOW (ref 8.5–10.1)
Chloride: 92 mmol/L — ABNORMAL LOW (ref 98–107)
Creatinine: 0.74 mg/dL (ref 0.60–1.30)
EGFR (African American): >60
EGFR (Non-African Amer.): >60
SGOT(AST): 30 U/L (ref 15–37)
SGPT (ALT): 11 U/L — ABNORMAL LOW (ref 12–78)
Total Protein: 4.8 g/dL — ABNORMAL LOW (ref 6.4–8.2)

## 2013-05-09 LAB — MAGNESIUM: Magnesium: 2.1 mg/dL

## 2013-05-10 ENCOUNTER — Telehealth: Payer: Self-pay

## 2013-05-10 DIAGNOSIS — I4891 Unspecified atrial fibrillation: Secondary | ICD-10-CM

## 2013-05-10 LAB — BASIC METABOLIC PANEL
Anion Gap: 8 (ref 7–16)
Calcium, Total: 7.5 mg/dL — ABNORMAL LOW (ref 8.5–10.1)
Chloride: 94 mmol/L — ABNORMAL LOW (ref 98–107)
Co2: 27 mmol/L (ref 21–32)
Creatinine: 0.59 mg/dL — ABNORMAL LOW (ref 0.60–1.30)
EGFR (African American): >60
EGFR (Non-African Amer.): >60
Glucose: 85 mg/dL (ref 65–99)
Osmolality: 257 (ref 275–301)
Potassium: 3.2 mmol/L — ABNORMAL LOW (ref 3.5–5.1)

## 2013-05-10 NOTE — Telephone Encounter (Signed)
TCM  

## 2013-05-10 NOTE — Telephone Encounter (Signed)
Message copied by Marcelle Overlie on Mon May 10, 2013 10:17 AM ------      Message from: Coralee Rud      Created: Mon May 10, 2013 10:14 AM      Regarding: tcm/ph       05/24/13 at 2:15 dr Kirke Corin ------

## 2013-05-11 ENCOUNTER — Encounter: Payer: Self-pay | Admitting: *Deleted

## 2013-05-11 NOTE — Telephone Encounter (Signed)
lmtcb

## 2013-05-12 NOTE — Telephone Encounter (Signed)
TCM #2 attempt LMTCB

## 2013-05-13 ENCOUNTER — Telehealth: Payer: Self-pay | Admitting: *Deleted

## 2013-05-13 ENCOUNTER — Telehealth: Payer: Self-pay

## 2013-05-13 ENCOUNTER — Encounter: Payer: Self-pay | Admitting: General Surgery

## 2013-05-13 NOTE — Telephone Encounter (Signed)
Phone call from [facility] states she has drainage from 2 of the 3 incisions (lateral and right) on her abdomen, pinkish in color.  Small amount of redness to lateral incision.  Pt asking if she can shower?  Follow up appt is 05-24-13.

## 2013-05-13 NOTE — Telephone Encounter (Signed)
Patient contacted regarding discharge from [facility] on 05/10/13.  Patient understands to follow up with provider Dr. Kirke Corin on 05/24/13 at 2:15 at [facility] office. Patient understands discharge instructions? yes Patient understands medications and regiment? yes Patient understands to bring all medications to this visit? yes  i spoke with pt's dtr who says pt doing well. Denies worsening sob Has PT coming to home regularly Had laparoscopic surg while in hospital by Dr. Evette Cristal; noticing some drainage from these incisions that are clear with pink tint to them They called Dr. Luan Moore office this am and they told her some drainage expected and to call if drainage is brown, yellow or green/foul odor

## 2013-05-13 NOTE — Telephone Encounter (Signed)
Continue dressing changes.  Call back for increased in amount, change in color or increased in pain.  S/S infection reviewed.  She may shower.  F/U as scheduled  Per Dr Evette Cristal.

## 2013-05-13 NOTE — Telephone Encounter (Signed)
She has only portsites.. OK to shower.

## 2013-05-17 LAB — COMPREHENSIVE METABOLIC PANEL
Albumin: 1.6 g/dL — ABNORMAL LOW (ref 3.4–5.0)
Alkaline Phosphatase: 132 U/L (ref 50–136)
BUN: 8 mg/dL (ref 7–18)
Bilirubin,Total: 0.4 mg/dL (ref 0.2–1.0)
Calcium, Total: 8.1 mg/dL — ABNORMAL LOW (ref 8.5–10.1)
Co2: 34 mmol/L — ABNORMAL HIGH (ref 21–32)
Creatinine: 0.88 mg/dL (ref 0.60–1.30)
EGFR (African American): >60
EGFR (Non-African Amer.): >60
Glucose: 115 mg/dL — ABNORMAL HIGH (ref 65–99)
Osmolality: 243 (ref 275–301)
Potassium: 3.7 mmol/L (ref 3.5–5.1)
SGPT (ALT): 31 U/L (ref 12–78)

## 2013-05-17 LAB — CBC CANCER CENTER
Basophil #: 0.1 x10 3/mm (ref 0.0–0.1)
Eosinophil #: 0.2 x10 3/mm (ref 0.0–0.7)
Eosinophil %: 2.4 %
HGB: 10.5 g/dL — ABNORMAL LOW (ref 12.0–16.0)
Lymphocyte #: 0.4 x10 3/mm — ABNORMAL LOW (ref 1.0–3.6)
MCH: 33.7 pg (ref 26.0–34.0)
MCHC: 34.3 g/dL (ref 32.0–36.0)
MCV: 98 fL (ref 80–100)
Monocyte #: 0.6 x10 3/mm (ref 0.2–0.9)
Monocyte %: 7.7 %
Neutrophil #: 6 x10 3/mm (ref 1.4–6.5)
Neutrophil %: 83.7 %
WBC: 7.2 x10 3/mm (ref 3.6–11.0)

## 2013-05-18 LAB — CA 125: CA 125: 816.1 U/mL — ABNORMAL HIGH (ref 0.0–34.0)

## 2013-05-24 ENCOUNTER — Encounter: Payer: Self-pay | Admitting: General Surgery

## 2013-05-24 ENCOUNTER — Ambulatory Visit (INDEPENDENT_AMBULATORY_CARE_PROVIDER_SITE_OTHER): Payer: Medicare Other | Admitting: General Surgery

## 2013-05-24 ENCOUNTER — Encounter: Payer: Self-pay | Admitting: Cardiovascular Disease

## 2013-05-24 ENCOUNTER — Ambulatory Visit (INDEPENDENT_AMBULATORY_CARE_PROVIDER_SITE_OTHER): Payer: Medicare Other | Admitting: Cardiovascular Disease

## 2013-05-24 VITALS — BP 110/64 | HR 68 | Resp 14 | Ht 60.0 in | Wt 156.0 lb

## 2013-05-24 VITALS — BP 102/60 | HR 92 | Ht 63.0 in | Wt 155.8 lb

## 2013-05-24 DIAGNOSIS — I482 Chronic atrial fibrillation, unspecified: Secondary | ICD-10-CM | POA: Insufficient documentation

## 2013-05-24 DIAGNOSIS — K432 Incisional hernia without obstruction or gangrene: Secondary | ICD-10-CM

## 2013-05-24 DIAGNOSIS — K56609 Unspecified intestinal obstruction, unspecified as to partial versus complete obstruction: Secondary | ICD-10-CM

## 2013-05-24 DIAGNOSIS — I5022 Chronic systolic (congestive) heart failure: Secondary | ICD-10-CM

## 2013-05-24 DIAGNOSIS — I4891 Unspecified atrial fibrillation: Secondary | ICD-10-CM

## 2013-05-24 DIAGNOSIS — R180 Malignant ascites: Secondary | ICD-10-CM

## 2013-05-24 MED ORDER — POTASSIUM CHLORIDE CRYS ER 20 MEQ PO TBCR: 20.0000 meq | EXTENDED_RELEASE_TABLET | Freq: Every day | ORAL | Status: DC

## 2013-05-24 MED ORDER — TORSEMIDE 20 MG PO TABS: 40.0000 mg | ORAL_TABLET | Freq: Two times a day (BID) | ORAL | Status: DC

## 2013-05-24 MED ORDER — TORSEMIDE 20 MG PO TABS: 20.0000 mg | ORAL_TABLET | Freq: Two times a day (BID) | ORAL | Status: DC

## 2013-05-24 NOTE — Progress Notes (Signed)
Patient ID: Nicole Andrews, female   DOB: [birth year], 79 y.o.   MRN: [id number]  Chief Complaint  Patient presents with  . Other    2 week follow up hosital     HPI Nicole Andrews is a 79 y.o. female here following up from her recent surgery. Pt has ovarian CA undergoing chemo. Has known ascites and incisional hernias. She developed symptoms of partial small bowel obstruction and CT suggested a transition area in region of her incisional hernia. She underwent laparoscopy with finding of a focal small bowel adhesion to abd wall as cause of obstruction. Ascites was drained, hernia repaired with Physio mesh. Ever since she has had no obstructive symptoms. She had chemo last week and has felt very weak and nauseated since.  HPI  Past Medical History  Diagnosis Date  . Hx of transient ischemic attack (TIA)   . Hyperlipidemia   . Heart murmur   . Thyroid disease   . Ovarian cancer   . Ovarian carcinoma   . Chronic atrial fibrillation   . Chronic systolic heart failure     Past Surgical History  Procedure Laterality Date  . Laparoscopy    . Appendectomy    . Abdominal hysterectomy    . Breast biopsy      History reviewed. No pertinent family history.  Social History History  Substance Use Topics  . Smoking status: Former Smoker -- 0.25 packs/day    Types: Cigarettes  . Smokeless tobacco: Never Used  . Alcohol Use: No    No Known Allergies  Current Outpatient Prescriptions  Medication Sig Dispense Refill  . aspirin 81 MG tablet Take 81 mg by mouth daily.      . calcium-vitamin D (OSCAL WITH D) 500-200 MG-UNIT per tablet Take 1 tablet by mouth daily.      Marland Kitchen docusate sodium (COLACE) 50 MG capsule Take by mouth daily as needed for constipation.      Marland Kitchen levothyroxine (SYNTHROID, LEVOTHROID) 100 MCG tablet Take 100 mcg by mouth daily before breakfast.      . metoprolol tartrate (LOPRESSOR) 25 MG tablet Take 25 mg by mouth 2 (two) times daily.      . ondansetron (ZOFRAN) 4 MG tablet  Take 4 mg by mouth every 8 (eight) hours as needed for nausea.      . pravastatin (PRAVACHOL) 40 MG tablet Take 40 mg by mouth daily.      . vitamin E 400 UNIT capsule Take 400 Units by mouth daily.      . potassium chloride SA (K-DUR,KLOR-CON) 20 MEQ tablet Take 1 tablet (20 mEq total) by mouth daily.  30 tablet  3  . torsemide (DEMADEX) 20 MG tablet Take 2 tablets (40 mg total) by mouth 2 (two) times daily.  120 tablet  3   No current facility-administered medications for this visit.    Review of Systems Review of Systems  Constitutional: Negative.   Respiratory: Negative.   Cardiovascular: Negative.     Blood pressure 110/64, pulse 68, resp. rate 14, height 5' (1.524 m), weight 156 lb (70.761 kg).  Physical Exam Physical Exam  Abdominal: She exhibits distension, fluid wave and ascites. There is no tenderness.  the is fullness at site of incisional hernias  Port site are clean. Hernia siteds filled with ascitic fluid. Data Reviewed none  Assessment    Post op lysis of adhesion and repair of incisional hernia. Recurrence of large ascites    Plan    Will discuss with Dr.  Choksi regarding possible pleurx catheter.        Huxley Vanwagoner G 05/24/2013, 7:16 PM

## 2013-05-24 NOTE — Patient Instructions (Signed)
Will call pt after discussing placing a peritoneal catheter with Dr. Doylene Canning on his return next week.

## 2013-05-24 NOTE — Patient Instructions (Addendum)
Change Torsemide to 40 mg twice daily for 1 week then back to once daily.  Start K-dur 20 meq once daily.  Check BMP in 3 days Follow up in 1 month

## 2013-05-24 NOTE — Assessment & Plan Note (Signed)
She seems to be significantly fluid overloaded with mostly signs of right-sided heart failure. Some of this might be also related to ovarian cancer. I will increase furosemide to 40 mg twice daily for one week then decrease back to 40 mg once daily. I added potassium 20 mEq once daily. I will check basic metabolic profile in 3 days. I will consider a repeat echocardiogram given that her most recent echo was in 2012.

## 2013-05-24 NOTE — Progress Notes (Signed)
Primary care physician: Dr. Arlana Pouch.  Oncologist: Dr. Doylene Canning  HPI  This is a 79 year old female who is here today for followup visit after recent hospitalization. She has chronic atrial fibrillation. She suffers from ovarian cancer and currently getting chemotherapy with leucovorin, fluorouracil and oxaliplatin. This is being managed by Dr. Doylene Canning. She presented recently with small bowel obstruction that required surgery. We will consulted for preoperative evaluation given abdomen EKG. The patient had no symptoms of angina and was cleared to have the surgery done which was performed without complications. She was started on metoprolol 25 mg twice daily for rate control. Amlodipine was discontinued. She is now back on chemotherapy. She is feeling weak and nauseous. She continues to have significant lower extremity edema as well as abdominal distention likely due to ascites. She was switched from furosemide to torsemide 40 mg once daily. However, she reports no significant improvement. Echocardiogram in 2012 showed normal LV systolic function with moderate to severe mitral and tricuspid regurgitation.  No Known Allergies   Current Outpatient Prescriptions on File Prior to Visit  Medication Sig Dispense Refill  . aspirin 81 MG tablet Take 81 mg by mouth daily.      . calcium-vitamin D (OSCAL WITH D) 500-200 MG-UNIT per tablet Take 1 tablet by mouth daily.      Marland Kitchen docusate sodium (COLACE) 50 MG capsule Take by mouth daily as needed for constipation.      Marland Kitchen levothyroxine (SYNTHROID, LEVOTHROID) 100 MCG tablet Take 100 mcg by mouth daily before breakfast.      . metoprolol tartrate (LOPRESSOR) 25 MG tablet Take 25 mg by mouth 2 (two) times daily.      . ondansetron (ZOFRAN) 4 MG tablet Take 4 mg by mouth every 8 (eight) hours as needed for nausea.      . pravastatin (PRAVACHOL) 40 MG tablet Take 40 mg by mouth daily.      . vitamin E 400 UNIT capsule Take 400 Units by mouth daily.       No current  facility-administered medications on file prior to visit.     Past Medical History  Diagnosis Date  . Hx of transient ischemic attack (TIA)   . Hyperlipidemia   . Heart murmur   . Thyroid disease   . Ovarian cancer   . Ovarian carcinoma   . Chronic atrial fibrillation      Past Surgical History  Procedure Laterality Date  . Laparoscopy    . Appendectomy    . Abdominal hysterectomy    . Breast biopsy       History reviewed. No pertinent family history.   History   Social History  . Marital Status: Widowed    Spouse Name: N/A    Number of Children: N/A  . Years of Education: N/A   Occupational History  . Not on file.   Social History Main Topics  . Smoking status: Former Smoker -- 0.25 packs/day    Types: Cigarettes  . Smokeless tobacco: Never Used  . Alcohol Use: No  . Drug Use: No  . Sexually Active: Not on file   Other Topics Concern  . Not on file   Social History Narrative  . No narrative on file     PHYSICAL EXAM   BP 102/60  Pulse 92  Ht 5\' 3"  (1.6 m)  Wt 155 lb 12 oz (70.648 kg)  BMI 27.6 kg/m2 Constitutional: She is oriented to person, place, and time. She appears ill but in no distress.  HENT: No nasal discharge.  Head: Normocephalic and atraumatic.  Eyes: Pupils are equal and round.  Neck: Normal range of motion. Neck supple. No JVD present. No thyromegaly present.  Cardiovascular: Normal rate, irregular rhythm, normal heart sounds. Exam reveals no gallop and no friction rub. No murmur heard.  Pulmonary/Chest: Effort normal and breath sounds normal. No stridor. No respiratory distress. She has no wheezes. She has no rales. She exhibits no tenderness.  Abdominal: Soft. Mild distension, likely ascites.  Musculoskeletal: Normal range of motion. She exhibits +3 edema to mid thigh and no tenderness.  Neurological: She is alert and oriented to person, place, and time. Coordination normal.  Skin: Skin is warm and dry. No rash noted. She is  not diaphoretic. No erythema. No pallor.  Psychiatric: She has a normal mood and affect. Her behavior is normal. Judgment and thought content normal.     EKG: Atrial fibrillation  -  Diffuse nonspecific T-abnormality.   Low voltage -possible pulmonary disease.   ABNORMAL     ASSESSMENT AND PLAN

## 2013-05-24 NOTE — Assessment & Plan Note (Signed)
Ventricular rate is reasonably controlled with metoprolol 25 mg twice daily. Given that she is actively receiving chemotherapy with potential bone marrow suppression, I do think it's a good time to start anticoagulation. Her prognosis from the cancer standpoint also seems to be poor.

## 2013-05-25 ENCOUNTER — Inpatient Hospital Stay: Payer: Self-pay | Admitting: Oncology

## 2013-05-25 LAB — CBC CANCER CENTER
Basophil #: 0 x10 3/mm (ref 0.0–0.1)
Basophil %: 0.1 %
Eosinophil #: 0 x10 3/mm (ref 0.0–0.7)
Eosinophil %: 0.7 %
HGB: 10.4 g/dL — ABNORMAL LOW (ref 12.0–16.0)
Lymphocyte %: 2.9 %
MCV: 94 fL (ref 80–100)
Monocyte #: 0.2 x10 3/mm (ref 0.2–0.9)
Monocyte %: 3.3 %
Neutrophil #: 5.2 x10 3/mm (ref 1.4–6.5)
Neutrophil %: 93 %
RBC: 3.04 10*6/uL — ABNORMAL LOW (ref 3.80–5.20)
RDW: 15.5 % — ABNORMAL HIGH (ref 11.5–14.5)
WBC: 5.6 x10 3/mm (ref 3.6–11.0)

## 2013-05-25 LAB — COMPREHENSIVE METABOLIC PANEL
Albumin: 1.9 g/dL — ABNORMAL LOW (ref 3.4–5.0)
Anion Gap: 5 — ABNORMAL LOW (ref 7–16)
Bilirubin,Total: 0.6 mg/dL (ref 0.2–1.0)
Chloride: 65 mmol/L — ABNORMAL LOW (ref 98–107)
Co2: 38 mmol/L — ABNORMAL HIGH (ref 21–32)
Creatinine: 1.05 mg/dL (ref 0.60–1.30)
EGFR (African American): 59 — ABNORMAL LOW
EGFR (Non-African Amer.): 51 — ABNORMAL LOW
Total Protein: 5.8 g/dL — ABNORMAL LOW (ref 6.4–8.2)

## 2013-05-26 LAB — COMPREHENSIVE METABOLIC PANEL
Albumin: 1.5 g/dL — ABNORMAL LOW (ref 3.4–5.0)
Alkaline Phosphatase: 81 U/L (ref 50–136)
BUN: 9 mg/dL (ref 7–18)
Bilirubin,Total: 0.4 mg/dL (ref 0.2–1.0)
Calcium, Total: 6.8 mg/dL — CL (ref 8.5–10.1)
Chloride: 71 mmol/L — ABNORMAL LOW (ref 98–107)
Co2: 38 mmol/L — ABNORMAL HIGH (ref 21–32)
Creatinine: 0.83 mg/dL (ref 0.60–1.30)
EGFR (African American): >60
EGFR (Non-African Amer.): >60
Glucose: 70 mg/dL (ref 65–99)
Potassium: 2.5 mmol/L — CL (ref 3.5–5.1)
SGOT(AST): 42 U/L — ABNORMAL HIGH (ref 15–37)
SGPT (ALT): 17 U/L (ref 12–78)
Sodium: 116 mmol/L — CL (ref 136–145)
Total Protein: 4.7 g/dL — ABNORMAL LOW (ref 6.4–8.2)

## 2013-05-26 LAB — CBC WITH DIFFERENTIAL/PLATELET
Basophil #: 0 10*3/uL (ref 0.0–0.1)
Eosinophil %: 4 %
HGB: 9.1 g/dL — ABNORMAL LOW (ref 12.0–16.0)
Lymphocyte #: 0.2 10*3/uL — ABNORMAL LOW (ref 1.0–3.6)
MCH: 34 pg (ref 26.0–34.0)
MCHC: 35.8 g/dL (ref 32.0–36.0)
Monocyte #: 0.2 x10 3/mm (ref 0.2–0.9)
Monocyte %: 3.5 %
Neutrophil #: 3.8 10*3/uL (ref 1.4–6.5)
Neutrophil %: 88.5 %
Platelet: 126 10*3/uL — ABNORMAL LOW (ref 150–440)
RBC: 2.69 10*6/uL — ABNORMAL LOW (ref 3.80–5.20)
RDW: 15.4 % — ABNORMAL HIGH (ref 11.5–14.5)

## 2013-05-27 ENCOUNTER — Other Ambulatory Visit: Payer: Medicare Other

## 2013-05-27 LAB — COMPREHENSIVE METABOLIC PANEL
Albumin: 1.5 g/dL — ABNORMAL LOW (ref 3.4–5.0)
Alkaline Phosphatase: 74 U/L (ref 50–136)
Calcium, Total: 7.1 mg/dL — ABNORMAL LOW (ref 8.5–10.1)
Chloride: 79 mmol/L — ABNORMAL LOW (ref 98–107)
Co2: 36 mmol/L — ABNORMAL HIGH (ref 21–32)
Creatinine: 0.81 mg/dL (ref 0.60–1.30)
EGFR (Non-African Amer.): >60
Potassium: 3.1 mmol/L — ABNORMAL LOW (ref 3.5–5.1)
SGOT(AST): 44 U/L — ABNORMAL HIGH (ref 15–37)
SGPT (ALT): 17 U/L (ref 12–78)
Sodium: 120 mmol/L — CL (ref 136–145)

## 2013-05-27 LAB — CBC WITH DIFFERENTIAL/PLATELET
Basophil %: 0.3 %
Eosinophil #: 0.1 10*3/uL (ref 0.0–0.7)
Eosinophil %: 3.6 %
HGB: 8.9 g/dL — ABNORMAL LOW (ref 12.0–16.0)
Lymphocyte #: 0.1 10*3/uL — ABNORMAL LOW (ref 1.0–3.6)
Lymphocyte %: 3.5 %
MCHC: 35.6 g/dL (ref 32.0–36.0)
MCV: 96 fL (ref 80–100)
Monocyte #: 0.1 x10 3/mm — ABNORMAL LOW (ref 0.2–0.9)
Neutrophil #: 3.3 10*3/uL (ref 1.4–6.5)
Neutrophil %: 88.8 %
Platelet: 111 10*3/uL — ABNORMAL LOW (ref 150–440)
RBC: 2.59 10*6/uL — ABNORMAL LOW (ref 3.80–5.20)
RDW: 15.2 % — ABNORMAL HIGH (ref 11.5–14.5)
WBC: 3.8 10*3/uL (ref 3.6–11.0)

## 2013-05-28 ENCOUNTER — Telehealth: Payer: Self-pay

## 2013-05-28 LAB — COMPREHENSIVE METABOLIC PANEL
Alkaline Phosphatase: 91 U/L (ref 50–136)
BUN: 8 mg/dL (ref 7–18)
Bilirubin,Total: 0.4 mg/dL (ref 0.2–1.0)
Calcium, Total: 7.6 mg/dL — ABNORMAL LOW (ref 8.5–10.1)
Creatinine: 0.75 mg/dL (ref 0.60–1.30)
EGFR (African American): >60
EGFR (Non-African Amer.): >60
Osmolality: 259 (ref 275–301)
Potassium: 3.6 mmol/L (ref 3.5–5.1)
SGOT(AST): 35 U/L (ref 15–37)
Total Protein: 5 g/dL — ABNORMAL LOW (ref 6.4–8.2)

## 2013-05-28 LAB — CBC WITH DIFFERENTIAL/PLATELET
Basophil #: 0 10*3/uL (ref 0.0–0.1)
Eosinophil #: 0.1 10*3/uL (ref 0.0–0.7)
Eosinophil %: 1.4 %
HCT: 29.1 % — ABNORMAL LOW (ref 35.0–47.0)
Lymphocyte #: 0.1 10*3/uL — ABNORMAL LOW (ref 1.0–3.6)
Lymphocyte %: 3.2 %
MCHC: 34.9 g/dL (ref 32.0–36.0)
MCV: 97 fL (ref 80–100)
Monocyte %: 4.5 %
Neutrophil #: 3.4 10*3/uL (ref 1.4–6.5)
Neutrophil %: 90.4 %
RBC: 2.99 10*6/uL — ABNORMAL LOW (ref 3.80–5.20)
RDW: 15.8 % — ABNORMAL HIGH (ref 11.5–14.5)
WBC: 3.8 10*3/uL (ref 3.6–11.0)

## 2013-05-28 NOTE — Telephone Encounter (Signed)
Message copied by Marcelle Overlie on Fri May 28, 2013  1:42 PM ------      Message from: Coralee Rud      Created: Fri May 28, 2013 12:40 PM      Regarding: tcm/ph       06/03/13 dr Kirke Corin 2:45 ------

## 2013-05-28 NOTE — Telephone Encounter (Signed)
tcm

## 2013-05-28 NOTE — Telephone Encounter (Signed)
Pt d/c 6/27 Will attempt TCM #1 6/30

## 2013-05-31 LAB — COMPREHENSIVE METABOLIC PANEL
Bilirubin,Total: 0.6 mg/dL (ref 0.2–1.0)
Chloride: 86 mmol/L — ABNORMAL LOW (ref 98–107)
Co2: 35 mmol/L — ABNORMAL HIGH (ref 21–32)
Creatinine: 1.2 mg/dL (ref 0.60–1.30)
EGFR (African American): 50 — ABNORMAL LOW
SGPT (ALT): 17 U/L (ref 12–78)
Total Protein: 5.7 g/dL — ABNORMAL LOW (ref 6.4–8.2)

## 2013-05-31 LAB — CBC CANCER CENTER
Basophil %: 1 %
Eosinophil %: 1.6 %
Lymphocyte #: 0.2 x10 3/mm — ABNORMAL LOW (ref 1.0–3.6)
MCHC: 34.6 g/dL (ref 32.0–36.0)
Monocyte #: 0.3 x10 3/mm (ref 0.2–0.9)
Monocyte %: 10.9 %
Neutrophil #: 2.2 x10 3/mm (ref 1.4–6.5)
Neutrophil %: 79.1 %
RBC: 3.21 10*6/uL — ABNORMAL LOW (ref 3.80–5.20)
RDW: 16.8 % — ABNORMAL HIGH (ref 11.5–14.5)
WBC: 2.8 x10 3/mm — ABNORMAL LOW (ref 3.6–11.0)

## 2013-05-31 NOTE — Telephone Encounter (Signed)
Tcm#1 attempt 05/31/13

## 2013-06-01 ENCOUNTER — Ambulatory Visit: Payer: Self-pay | Admitting: Oncology

## 2013-06-01 ENCOUNTER — Encounter: Payer: Self-pay | Admitting: *Deleted

## 2013-06-01 NOTE — Telephone Encounter (Signed)
TCM #2 attempt Not available

## 2013-06-03 ENCOUNTER — Encounter: Payer: Self-pay | Admitting: Cardiovascular Disease

## 2013-06-03 ENCOUNTER — Ambulatory Visit (INDEPENDENT_AMBULATORY_CARE_PROVIDER_SITE_OTHER): Payer: Medicare Other | Admitting: Cardiovascular Disease

## 2013-06-03 VITALS — BP 87/55 | HR 105 | Ht 61.0 in | Wt 147.0 lb

## 2013-06-03 DIAGNOSIS — I482 Chronic atrial fibrillation, unspecified: Secondary | ICD-10-CM

## 2013-06-03 DIAGNOSIS — I4891 Unspecified atrial fibrillation: Secondary | ICD-10-CM

## 2013-06-03 DIAGNOSIS — R180 Malignant ascites: Secondary | ICD-10-CM

## 2013-06-03 MED ORDER — SPIRONOLACTONE 25 MG PO TABS: 25.0000 mg | ORAL_TABLET | Freq: Every day | ORAL | Status: AC

## 2013-06-03 MED ORDER — POTASSIUM CHLORIDE 20 MEQ PO PACK: 20.0000 meq | PACK | Freq: Two times a day (BID) | ORAL | Status: DC

## 2013-06-03 NOTE — Patient Instructions (Addendum)
Start Spironolactone 25 mg once daily.  Take Potassium 20 meq packet twice daily.  Check labs on Monday.   Follow up in 1 months

## 2013-06-03 NOTE — Progress Notes (Signed)
Primary care physician: Dr. Arlana Pouch.  Oncologist: Dr. Doylene Canning  HPI  This is a 79 year old female who is here today for followup visit after recent hospitalization. She has chronic atrial fibrillation. She suffers from ovarian cancer and currently getting chemotherapy with leucovorin, fluorouracil and oxaliplatin. This is being managed by Dr. Doylene Canning. She presented recently with small bowel obstruction that required surgery. She was started on metoprolol 25 mg twice daily for rate control. Amlodipine was discontinued. Echocardiogram in 2012 showed normal LV systolic function with moderate to severe mitral and tricuspid regurgitation. She has been having significant problems with fluid overload manifesting as ascites and lower extremity edema. The dose of Demadex was increased during last visit. Followup labs show to be a hyponatremia (110) and hypokalemia (2.6). She was admitted briefly at [facility] and was given hypertonic saline with IV potassium. She has not been able to swallow potassium tablet. She is losing weight but continues to have significant fluid overload. It appears that she is getting significantly worse from a cancer standpoint. Chemotherapy currently is on hold.  No Known Allergies   Current Outpatient Prescriptions on File Prior to Visit  Medication Sig Dispense Refill  . acetaminophen (TYLENOL) 325 MG tablet Take 650 mg by mouth every 6 (six) hours as needed for pain.      Marland Kitchen aspirin 81 MG tablet Take 81 mg by mouth daily.      . calcium-vitamin D (OSCAL WITH D) 500-200 MG-UNIT per tablet Take 1 tablet by mouth daily.      Marland Kitchen docusate sodium (COLACE) 50 MG capsule Take by mouth daily as needed for constipation.      Marland Kitchen levothyroxine (SYNTHROID, LEVOTHROID) 100 MCG tablet Take 100 mcg by mouth daily before breakfast.      . metoprolol tartrate (LOPRESSOR) 25 MG tablet Take 25 mg by mouth 2 (two) times daily.      . ondansetron (ZOFRAN) 4 MG tablet Take 4 mg by mouth every 8 (eight) hours as  needed for nausea.      . pravastatin (PRAVACHOL) 40 MG tablet Take 40 mg by mouth daily.      Marland Kitchen torsemide (DEMADEX) 20 MG tablet Take 20 mg by mouth 2 (two) times daily.       . vitamin E 400 UNIT capsule Take 400 Units by mouth daily.       No current facility-administered medications on file prior to visit.     Past Medical History  Diagnosis Date  . Hx of transient ischemic attack (TIA)   . Hyperlipidemia   . Heart murmur   . Thyroid disease   . Ovarian cancer   . Ovarian carcinoma   . Chronic atrial fibrillation   . Chronic systolic heart failure      Past Surgical History  Procedure Laterality Date  . Laparoscopy    . Appendectomy    . Abdominal hysterectomy    . Breast biopsy       History reviewed. No pertinent family history.   History   Social History  . Marital Status: Widowed    Spouse Name: N/A    Number of Children: N/A  . Years of Education: N/A   Occupational History  . Not on file.   Social History Main Topics  . Smoking status: Former Smoker -- 0.25 packs/day    Types: Cigarettes  . Smokeless tobacco: Never Used  . Alcohol Use: No  . Drug Use: No  . Sexually Active: Not on file   Other Topics  Concern  . Not on file   Social History Narrative  . No narrative on file     PHYSICAL EXAM   BP 87/55  Pulse 105  Ht 5\' 1"  (1.549 m)  Wt 147 lb (66.679 kg)  BMI 27.79 kg/m2 Constitutional: She is oriented to person, place, and time. She appears ill but in no distress.  HENT: No nasal discharge.  Head: Normocephalic and atraumatic.  Eyes: Pupils are equal and round.  Neck: Normal range of motion. Neck supple. No JVD present. No thyromegaly present.  Cardiovascular: Normal rate, irregular rhythm, normal heart sounds. Exam reveals no gallop and no friction rub. No murmur heard.  Pulmonary/Chest: Effort normal and breath sounds normal. No stridor. No respiratory distress. She has no wheezes. She has no rales. She exhibits no tenderness.    Abdominal: Soft. Mild distension, likely ascites.  Musculoskeletal: Normal range of motion. She exhibits +3 edema to mid thigh and no tenderness.  Neurological: She is alert and oriented to person, place, and time. Coordination normal.  Skin: Skin is warm and dry. No rash noted. She is not diaphoretic. No erythema. No pallor.  Psychiatric: She has a normal mood and affect. Her behavior is normal. Judgment and thought content normal.     EKG: Atrial fibrillation  -  Diffuse nonspecific T-abnormality.   Low voltage -possible pulmonary disease.   ABNORMAL     ASSESSMENT AND PLAN

## 2013-06-04 NOTE — Assessment & Plan Note (Signed)
She continues to have significant ascites and lower extremity edema likely related to cancer. He likely a component of diastolic heart failure related to atrial fibrillation as well but I think that's less of a factor. It appears that her overall cancer condition is deteriorating. She continues to have significant fluid overload with recent problems of hyponatremia and hypokalemia. She's not able to swallow potassium tablets. I will switch this to powder form and add spironolactone 25 mg once daily. Treatment with short-term Tolvaptan can be considered. Check basic metabolic profile next week on Monday.

## 2013-06-04 NOTE — Assessment & Plan Note (Signed)
Ventricular rate is slightly increased. Continue current dose of metoprolol. Not a candidate for anticoagulation at this time.

## 2013-06-07 ENCOUNTER — Telehealth: Payer: Self-pay

## 2013-06-07 LAB — CBC CANCER CENTER
Basophil #: 0.1 x10 3/mm (ref 0.0–0.1)
Basophil %: 2 %
Eosinophil #: 0.1 x10 3/mm (ref 0.0–0.7)
HCT: 31 % — ABNORMAL LOW (ref 35.0–47.0)
MCHC: 34.4 g/dL (ref 32.0–36.0)
Monocyte %: 6.1 %
Neutrophil #: 4.2 x10 3/mm (ref 1.4–6.5)
Neutrophil %: 88 %
Platelet: 176 x10 3/mm (ref 150–440)
RBC: 3.07 10*6/uL — ABNORMAL LOW (ref 3.80–5.20)
RDW: 18.3 % — ABNORMAL HIGH (ref 11.5–14.5)

## 2013-06-07 LAB — COMPREHENSIVE METABOLIC PANEL
Anion Gap: 8 (ref 7–16)
BUN: 19 mg/dL — ABNORMAL HIGH (ref 7–18)
Calcium, Total: 8 mg/dL — ABNORMAL LOW (ref 8.5–10.1)
Chloride: 82 mmol/L — ABNORMAL LOW (ref 98–107)
Creatinine: 1.2 mg/dL (ref 0.60–1.30)
EGFR (Non-African Amer.): 44 — ABNORMAL LOW
Glucose: 168 mg/dL — ABNORMAL HIGH (ref 65–99)
Osmolality: 261 (ref 275–301)
SGOT(AST): 43 U/L — ABNORMAL HIGH (ref 15–37)
Sodium: 127 mmol/L — ABNORMAL LOW (ref 136–145)
Total Protein: 6 g/dL — ABNORMAL LOW (ref 6.4–8.2)

## 2013-06-07 NOTE — Telephone Encounter (Signed)
Pharmacist states pt would like 10 mg MEQ tab instead of 20 MEQ tabs, states pt is unable to swallow. Also wants dr Kirke Corin to know the interaction of spironodactone and potassium.

## 2013-06-07 NOTE — Telephone Encounter (Signed)
See below

## 2013-06-07 NOTE — Telephone Encounter (Signed)
I am aware of the interaction.  She has not been able to swallow potassium tablets. I sent her an Rx for Potassium powder. They should give that.

## 2013-06-07 NOTE — Telephone Encounter (Signed)
Daughter call re: spironolactond due to the potassium...please advise

## 2013-06-08 NOTE — Telephone Encounter (Signed)
Pharmacy informed Pharmacist tells me Dr. Doylene Canning d/c'd KCL yesterday and increased spironolactone to 50 mg  I called and spoke with dtr who confirms this She says they went to Dr. Doylene Canning yesterday and was told K=3.4 so he increased spironolactone and d/c'd kcl I will call to get these lab results faxed to [place] and will make Dr. Kirke Corin aware of the change

## 2013-06-08 NOTE — Telephone Encounter (Signed)
Ok  That sounds good

## 2013-06-09 LAB — COMPREHENSIVE METABOLIC PANEL
Albumin: 1.7 g/dL — ABNORMAL LOW (ref 3.4–5.0)
Alkaline Phosphatase: 129 U/L (ref 50–136)
Anion Gap: 2 — ABNORMAL LOW (ref 7–16)
Bilirubin,Total: 0.3 mg/dL (ref 0.2–1.0)
Calcium, Total: 8.7 mg/dL (ref 8.5–10.1)
Co2: 41 mmol/L (ref 21–32)
Osmolality: 258 (ref 275–301)
Potassium: 4.4 mmol/L (ref 3.5–5.1)
SGOT(AST): 49 U/L — ABNORMAL HIGH (ref 15–37)
Sodium: 126 mmol/L — ABNORMAL LOW (ref 136–145)

## 2013-06-09 LAB — CBC CANCER CENTER
Basophil #: 0 x10 3/mm (ref 0.0–0.1)
Basophil %: 0.1 %
Eosinophil %: 0.7 %
HCT: 32.8 % — ABNORMAL LOW (ref 35.0–47.0)
Lymphocyte #: 0.2 x10 3/mm — ABNORMAL LOW (ref 1.0–3.6)
MCH: 34.9 pg — ABNORMAL HIGH (ref 26.0–34.0)
MCHC: 34.7 g/dL (ref 32.0–36.0)
MCV: 101 fL — ABNORMAL HIGH (ref 80–100)
Monocyte #: 0.1 x10 3/mm — ABNORMAL LOW (ref 0.2–0.9)
Monocyte %: 1 %
Neutrophil #: 6 x10 3/mm (ref 1.4–6.5)
Neutrophil %: 95.5 %
Platelet: 178 x10 3/mm (ref 150–440)
RBC: 3.26 10*6/uL — ABNORMAL LOW (ref 3.80–5.20)

## 2013-06-14 LAB — CBC CANCER CENTER
Basophil #: 0 x10 3/mm (ref 0.0–0.1)
Eosinophil #: 0 x10 3/mm (ref 0.0–0.7)
Eosinophil %: 1.4 %
Lymphocyte %: 10.8 %
MCH: 35.4 pg — ABNORMAL HIGH (ref 26.0–34.0)
MCHC: 35.6 g/dL (ref 32.0–36.0)
Neutrophil #: 2.1 x10 3/mm (ref 1.4–6.5)
Neutrophil %: 83.9 %
Platelet: 86 x10 3/mm — ABNORMAL LOW (ref 150–440)
RDW: 18.5 % — ABNORMAL HIGH (ref 11.5–14.5)

## 2013-06-14 LAB — BASIC METABOLIC PANEL
Anion Gap: 5 — ABNORMAL LOW (ref 7–16)
Calcium, Total: 8.4 mg/dL — ABNORMAL LOW (ref 8.5–10.1)
Co2: 36 mmol/L — ABNORMAL HIGH (ref 21–32)
Creatinine: 1.38 mg/dL — ABNORMAL HIGH (ref 0.60–1.30)
EGFR (African American): 43 — ABNORMAL LOW
Glucose: 111 mg/dL — ABNORMAL HIGH (ref 65–99)
Potassium: 4.2 mmol/L (ref 3.5–5.1)
Sodium: 128 mmol/L — ABNORMAL LOW (ref 136–145)

## 2013-06-18 ENCOUNTER — Encounter: Payer: Self-pay | Admitting: *Deleted

## 2013-06-21 LAB — CBC CANCER CENTER
Basophil #: 0 "x10 3/mm "
Basophil %: 0.6 %
Eosinophil #: 0 "x10 3/mm "
Eosinophil %: 0.6 %
HCT: 30.5 % — ABNORMAL LOW
HGB: 10.5 g/dL — ABNORMAL LOW
Lymphocyte %: 25.7 %
Lymphs Abs: 0.2 "x10 3/mm " — ABNORMAL LOW
MCH: 34.5 pg — ABNORMAL HIGH
MCHC: 34.3 g/dL
MCV: 101 fL — ABNORMAL HIGH
Monocyte #: 0.2 "x10 3/mm "
Monocyte %: 25.2 %
Neutrophil #: 0.4 "x10 3/mm " — ABNORMAL LOW
Neutrophil %: 47.9 %
Platelet: 123 "x10 3/mm " — ABNORMAL LOW
RBC: 3.03 "x10 6/mm " — ABNORMAL LOW
RDW: 19.6 % — ABNORMAL HIGH
WBC: 0.8 "x10 3/mm " — CL

## 2013-06-21 LAB — BASIC METABOLIC PANEL WITH GFR
Anion Gap: 8
BUN: 27 mg/dL — ABNORMAL HIGH
Calcium, Total: 8.8 mg/dL
Chloride: 88 mmol/L — ABNORMAL LOW
Co2: 32 mmol/L
Creatinine: 1.52 mg/dL — ABNORMAL HIGH
EGFR (African American): 38 — ABNORMAL LOW
EGFR (Non-African Amer.): 33 — ABNORMAL LOW
Glucose: 132 mg/dL — ABNORMAL HIGH
Osmolality: 264
Potassium: 3.8 mmol/L
Sodium: 128 mmol/L — ABNORMAL LOW

## 2013-06-22 ENCOUNTER — Encounter: Payer: Self-pay | Admitting: General Surgery

## 2013-06-24 ENCOUNTER — Encounter: Payer: Self-pay | Admitting: *Deleted

## 2013-06-25 ENCOUNTER — Ambulatory Visit: Payer: Medicare Other | Admitting: Cardiovascular Disease

## 2013-06-28 LAB — CBC CANCER CENTER
Basophil %: 0.5 %
Eosinophil #: 0 x10 3/mm (ref 0.0–0.7)
HCT: 29.5 % — ABNORMAL LOW (ref 35.0–47.0)
HGB: 10.6 g/dL — ABNORMAL LOW (ref 12.0–16.0)
Lymphocyte %: 5.7 %
MCHC: 36 g/dL (ref 32.0–36.0)
MCV: 102 fL — ABNORMAL HIGH (ref 80–100)
Monocyte %: 4.4 %
Neutrophil #: 6.8 x10 3/mm — ABNORMAL HIGH (ref 1.4–6.5)
RBC: 2.88 10*6/uL — ABNORMAL LOW (ref 3.80–5.20)
RDW: 23.7 % — ABNORMAL HIGH (ref 11.5–14.5)
WBC: 7.6 x10 3/mm (ref 3.6–11.0)

## 2013-06-28 LAB — COMPREHENSIVE METABOLIC PANEL
Albumin: 1.5 g/dL — ABNORMAL LOW (ref 3.4–5.0)
Alkaline Phosphatase: 117 U/L (ref 50–136)
Anion Gap: 4 — ABNORMAL LOW (ref 7–16)
BUN: 27 mg/dL — ABNORMAL HIGH (ref 7–18)
Bilirubin,Total: 0.4 mg/dL (ref 0.2–1.0)
Calcium, Total: 8.1 mg/dL — ABNORMAL LOW (ref 8.5–10.1)
Chloride: 86 mmol/L — ABNORMAL LOW (ref 98–107)
Co2: 33 mmol/L — ABNORMAL HIGH (ref 21–32)
Creatinine: 1.78 mg/dL — ABNORMAL HIGH (ref 0.60–1.30)
EGFR (African American): 31 — ABNORMAL LOW
EGFR (Non-African Amer.): 27 — ABNORMAL LOW
SGPT (ALT): 10 U/L — ABNORMAL LOW (ref 12–78)
Total Protein: 5.7 g/dL — ABNORMAL LOW (ref 6.4–8.2)

## 2013-07-02 ENCOUNTER — Ambulatory Visit: Payer: Self-pay | Admitting: Oncology

## 2013-07-08 ENCOUNTER — Ambulatory Visit (INDEPENDENT_AMBULATORY_CARE_PROVIDER_SITE_OTHER): Payer: Medicare Other | Admitting: Cardiovascular Disease

## 2013-07-08 ENCOUNTER — Encounter: Payer: Self-pay | Admitting: Cardiovascular Disease

## 2013-07-08 VITALS — BP 89/58 | HR 118 | Ht 62.5 in | Wt 134.0 lb

## 2013-07-08 DIAGNOSIS — R079 Chest pain, unspecified: Secondary | ICD-10-CM

## 2013-07-08 DIAGNOSIS — I482 Chronic atrial fibrillation, unspecified: Secondary | ICD-10-CM

## 2013-07-08 DIAGNOSIS — R180 Malignant ascites: Secondary | ICD-10-CM

## 2013-07-08 DIAGNOSIS — I4891 Unspecified atrial fibrillation: Secondary | ICD-10-CM

## 2013-07-08 MED ORDER — AMIODARONE HCL 200 MG PO TABS: 200.0000 mg | ORAL_TABLET | Freq: Every day | ORAL | Status: AC

## 2013-07-08 NOTE — Assessment & Plan Note (Signed)
Volume overload improved with current dose of Demadex and spironolactone.

## 2013-07-08 NOTE — Patient Instructions (Addendum)
Start Amiodarone 200 mg once daily.  Continue other medications.  Follow up in 6 weeks.

## 2013-07-08 NOTE — Assessment & Plan Note (Signed)
Ventricular rate has been worsening and she is tachycardic today. Unfortunately, her blood pressure is low and thus I cannot increase the dose of metoprolol. I considered adding digoxin but I'm hesitant to do that given her fluctuating renal function. Thus, I decided to start amiodarone 200 mg once daily to improve rate control. She is actively getting chemotherapy and is not a candidate for anticoagulation especially with her very frail status.

## 2013-07-08 NOTE — Progress Notes (Signed)
Primary care physician: Dr. Arlana Pouch.  Oncologist: Dr. Doylene Canning  HPI  This is a 79 year old female who is here today for followup visit regarding chronic atrial fibrillation on chronic fluid overload related to ovarian cancer. She suffers from ovarian cancer and currently getting chemotherapy. This is being managed by Dr. Doylene Canning. She had small bowel obstruction few months ago which required surgery. She was started on metoprolol 25 mg twice daily for rate control. Amlodipine was discontinued. Echocardiogram in 2012 showed normal LV systolic function with moderate to severe mitral and tricuspid regurgitation. She has been having significant problems with fluid overload manifesting as ascites and lower extremity edema. She had severe hyponatremia hypokalemia. She was having hard time taking potassium pills and thus I added spironolactone in addition to Demadex. She follows up regularly at the cancer Center and get blood work done. She is gradually getting weaker. Complains of fatigue with mild dizziness.  No Known Allergies   Current Outpatient Prescriptions on File Prior to Visit  Medication Sig Dispense Refill  . acetaminophen (TYLENOL) 325 MG tablet Take 650 mg by mouth every 6 (six) hours as needed for pain.      Marland Kitchen aspirin 81 MG tablet Take 81 mg by mouth daily.      . calcium-vitamin D (OSCAL WITH D) 500-200 MG-UNIT per tablet Take 1 tablet by mouth daily.      Marland Kitchen docusate sodium (COLACE) 50 MG capsule Take by mouth daily as needed for constipation.      Marland Kitchen levothyroxine (SYNTHROID, LEVOTHROID) 100 MCG tablet Take 100 mcg by mouth daily before breakfast.      . metoprolol tartrate (LOPRESSOR) 25 MG tablet Take 25 mg by mouth 2 (two) times daily.      . ondansetron (ZOFRAN) 4 MG tablet Take 4 mg by mouth every 8 (eight) hours as needed for nausea.      . pravastatin (PRAVACHOL) 40 MG tablet Take 40 mg by mouth daily.      Marland Kitchen spironolactone (ALDACTONE) 25 MG tablet Take 1 tablet (25 mg total) by  mouth daily.  25 tablet  3  . torsemide (DEMADEX) 20 MG tablet Take four tablets in the am.      . vitamin E 400 UNIT capsule Take 400 Units by mouth daily.       No current facility-administered medications on file prior to visit.     Past Medical History  Diagnosis Date  . Hx of transient ischemic attack (TIA)   . Hyperlipidemia   . Heart murmur   . Thyroid disease   . Ovarian cancer   . Ovarian carcinoma   . Chronic atrial fibrillation   . Chronic systolic heart failure      Past Surgical History  Procedure Laterality Date  . Laparoscopy    . Appendectomy    . Abdominal hysterectomy    . Breast biopsy       Family History  Problem Relation Age of Onset  . Family history unknown: Yes     History   Social History  . Marital Status: Widowed    Spouse Name: N/A    Number of Children: N/A  . Years of Education: N/A   Occupational History  . Not on file.   Social History Main Topics  . Smoking status: Former Smoker -- 0.25 packs/day    Types: Cigarettes  . Smokeless tobacco: Never Used  . Alcohol Use: No  . Drug Use: No  . Sexually Active: Not on file  Other Topics Concern  . Not on file   Social History Narrative  . No narrative on file     PHYSICAL EXAM   BP 89/58  Pulse 118  Ht 5' 2.5" (1.588 m)  Wt 134 lb (60.782 kg)  BMI 24.1 kg/m2 Constitutional: She is oriented to person, place, and time. She appears ill but in no distress.  HENT: No nasal discharge.  Head: Normocephalic and atraumatic.  Eyes: Pupils are equal and round.  Neck: Normal range of motion. Neck supple. No JVD present. No thyromegaly present.  Cardiovascular: Tachycardic, irregular rhythm, normal heart sounds. Exam reveals no gallop and no friction rub. No murmur heard.  Pulmonary/Chest: Effort normal and breath sounds normal. No stridor. No respiratory distress. She has no wheezes. She has no rales. She exhibits no tenderness.  Abdominal: Soft. Mild distension, likely  ascites.  Musculoskeletal: Normal range of motion. She exhibits +2 edema to mid thigh and no tenderness.  Neurological: She is alert and oriented to person, place, and time. Coordination normal.  Skin: Skin is warm and dry. No rash noted. She is not diaphoretic. No erythema. No pallor.  Psychiatric: She has a normal mood and affect. Her behavior is normal. Judgment and thought content normal.     EKG: Atrial fibrillation  Diffuse low voltage.   -  Nonspecific T-abnormality.   Poor SNR (< 1) in leads  V1  ABNORMAL     ASSESSMENT AND PLAN

## 2013-07-16 ENCOUNTER — Inpatient Hospital Stay: Payer: Self-pay | Admitting: Oncology

## 2013-07-16 LAB — COMPREHENSIVE METABOLIC PANEL
Alkaline Phosphatase: 131 U/L (ref 50–136)
Anion Gap: 13 (ref 7–16)
BUN: 29 mg/dL — ABNORMAL HIGH (ref 7–18)
Bilirubin,Total: 0.5 mg/dL (ref 0.2–1.0)
Chloride: 84 mmol/L — ABNORMAL LOW (ref 98–107)
Creatinine: 1.76 mg/dL — ABNORMAL HIGH (ref 0.60–1.30)
EGFR (Non-African Amer.): 27 — ABNORMAL LOW
Osmolality: 256 (ref 275–301)
Potassium: 4 mmol/L (ref 3.5–5.1)
SGOT(AST): 26 U/L (ref 15–37)
SGPT (ALT): 13 U/L (ref 12–78)
Sodium: 123 mmol/L — ABNORMAL LOW (ref 136–145)

## 2013-07-16 LAB — CBC
HCT: 32.3 % — ABNORMAL LOW (ref 35.0–47.0)
HGB: 11 g/dL — ABNORMAL LOW (ref 12.0–16.0)
Platelet: 169 10*3/uL (ref 150–440)
RBC: 3.01 10*6/uL — ABNORMAL LOW (ref 3.80–5.20)
RDW: 24.7 % — ABNORMAL HIGH (ref 11.5–14.5)
WBC: 10.1 10*3/uL (ref 3.6–11.0)

## 2013-07-16 LAB — URINALYSIS, COMPLETE
Bilirubin,UR: NEGATIVE
Glucose,UR: NEGATIVE mg/dL (ref 0–75)
Hyaline Cast: 56
Leukocyte Esterase: NEGATIVE
Protein: NEGATIVE
RBC,UR: 29 /HPF (ref 0–5)
Squamous Epithelial: 2
WBC UR: 2 /HPF (ref 0–5)

## 2013-07-16 LAB — TROPONIN I: Troponin-I: <0.02 ng/mL

## 2013-07-17 LAB — COMPREHENSIVE METABOLIC PANEL
Alkaline Phosphatase: 104 U/L (ref 50–136)
Anion Gap: 6 — ABNORMAL LOW (ref 7–16)
BUN: 26 mg/dL — ABNORMAL HIGH (ref 7–18)
Bilirubin,Total: 0.5 mg/dL (ref 0.2–1.0)
Co2: 30 mmol/L (ref 21–32)
Creatinine: 1.36 mg/dL — ABNORMAL HIGH (ref 0.60–1.30)
EGFR (African American): 43 — ABNORMAL LOW
EGFR (Non-African Amer.): 37 — ABNORMAL LOW
Glucose: 82 mg/dL (ref 65–99)
Osmolality: 259 (ref 275–301)
Potassium: 3.8 mmol/L (ref 3.5–5.1)
SGPT (ALT): 9 U/L — ABNORMAL LOW (ref 12–78)

## 2013-07-17 LAB — CBC WITH DIFFERENTIAL/PLATELET
Basophil #: 0 10*3/uL (ref 0.0–0.1)
Basophil %: 0.3 %
Eosinophil %: 0.2 %
HGB: 8.3 g/dL — ABNORMAL LOW (ref 12.0–16.0)
Lymphocyte %: 4.5 %
MCHC: 34.9 g/dL (ref 32.0–36.0)
MCV: 106 fL — ABNORMAL HIGH (ref 80–100)
Neutrophil #: 5.8 10*3/uL (ref 1.4–6.5)
RDW: 24.5 % — ABNORMAL HIGH (ref 11.5–14.5)
WBC: 6.3 10*3/uL (ref 3.6–11.0)

## 2013-07-18 LAB — URINE CULTURE

## 2013-07-19 LAB — BASIC METABOLIC PANEL
Anion Gap: 8 (ref 7–16)
BUN: 16 mg/dL (ref 7–18)
Calcium, Total: 7.3 mg/dL — ABNORMAL LOW (ref 8.5–10.1)
Co2: 25 mmol/L (ref 21–32)
Creatinine: 0.99 mg/dL (ref 0.60–1.30)
EGFR (African American): >60
Potassium: 3.6 mmol/L (ref 3.5–5.1)
Sodium: 128 mmol/L — ABNORMAL LOW (ref 136–145)

## 2013-07-19 LAB — CBC WITH DIFFERENTIAL/PLATELET
Basophil #: 0 10*3/uL (ref 0.0–0.1)
Basophil %: 0.3 %
Eosinophil #: 0 10*3/uL (ref 0.0–0.7)
Eosinophil %: 0.1 %
HCT: 28.4 % — ABNORMAL LOW (ref 35.0–47.0)
HGB: 9.9 g/dL — ABNORMAL LOW (ref 12.0–16.0)
MCH: 37.2 pg — ABNORMAL HIGH (ref 26.0–34.0)
Monocyte %: 3.2 %
Neutrophil #: 8.8 10*3/uL — ABNORMAL HIGH (ref 1.4–6.5)
Neutrophil %: 92.4 %
Platelet: 110 10*3/uL — ABNORMAL LOW (ref 150–440)
RDW: 24.2 % — ABNORMAL HIGH (ref 11.5–14.5)
WBC: 9.6 10*3/uL (ref 3.6–11.0)

## 2013-07-20 LAB — BASIC METABOLIC PANEL
Anion Gap: 4 — ABNORMAL LOW (ref 7–16)
BUN: 16 mg/dL (ref 7–18)
Calcium, Total: 7.4 mg/dL — ABNORMAL LOW (ref 8.5–10.1)
Co2: 27 mmol/L (ref 21–32)
Creatinine: 0.92 mg/dL (ref 0.60–1.30)
Glucose: 81 mg/dL (ref 65–99)
Osmolality: 255 (ref 275–301)
Potassium: 3.8 mmol/L (ref 3.5–5.1)
Sodium: 127 mmol/L — ABNORMAL LOW (ref 136–145)

## 2013-08-02 ENCOUNTER — Ambulatory Visit: Payer: Self-pay | Admitting: Oncology

## 2013-08-02 DEATH — deceased

## 2013-08-19 ENCOUNTER — Ambulatory Visit: Payer: Medicare Other | Admitting: Cardiovascular Disease

## 2014-09-08 IMAGING — CR DG ABDOMEN 3V
1 series · 4 of 4 positions shown · non-contrast
Comparison: none

REASON FOR EXAM: cr dr Auntyjatty [REDACTED] nausea distension
COMMENTS:

[Series 1: pa · 0.17mm/px · 4 of 4 slices shown]
[im 1/4]
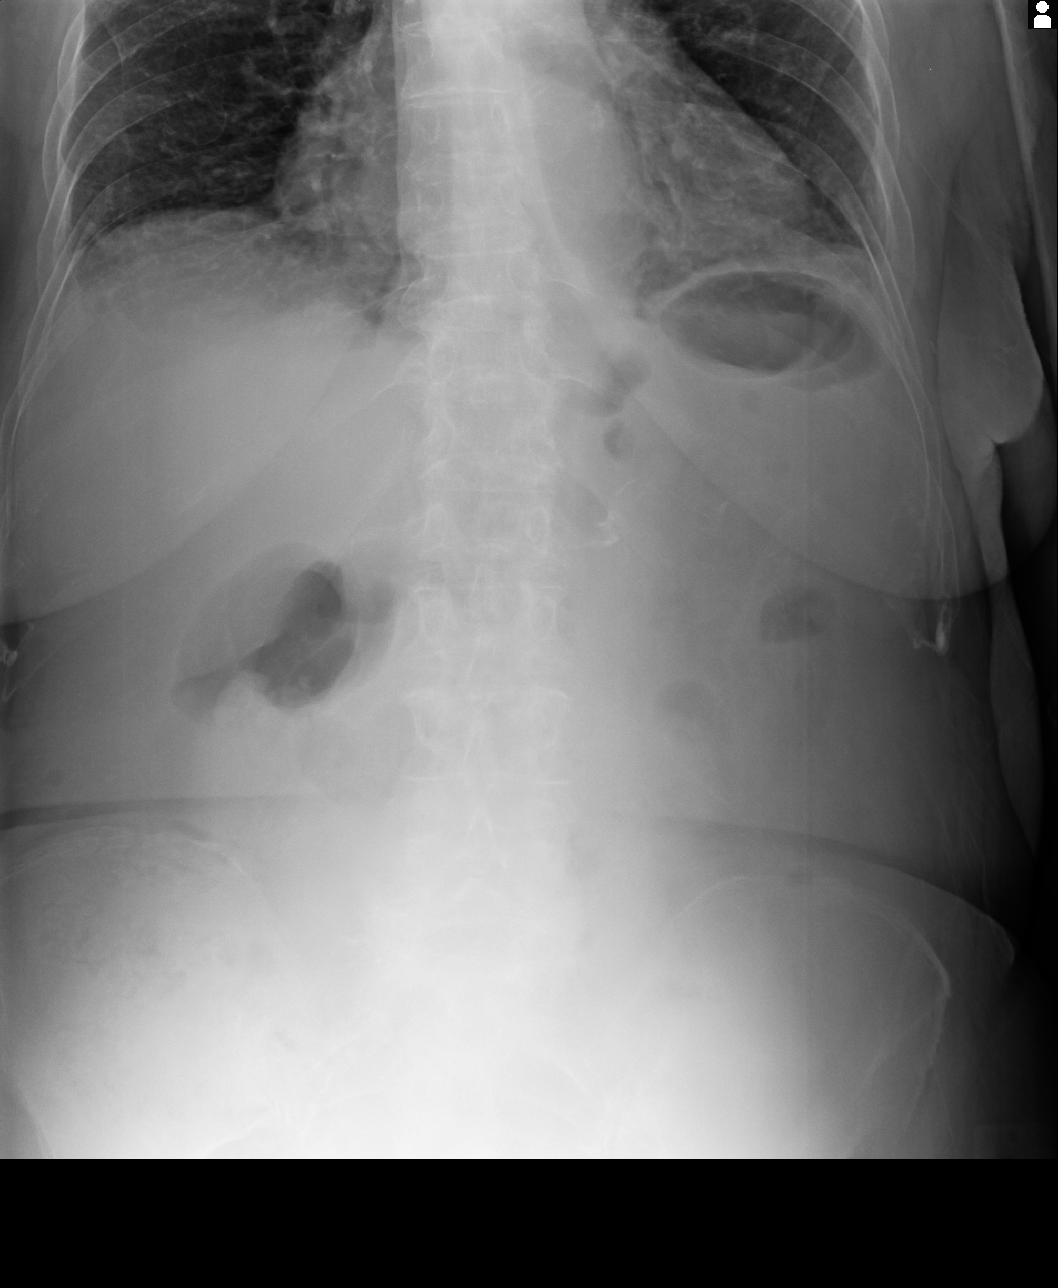
[im 2/4]
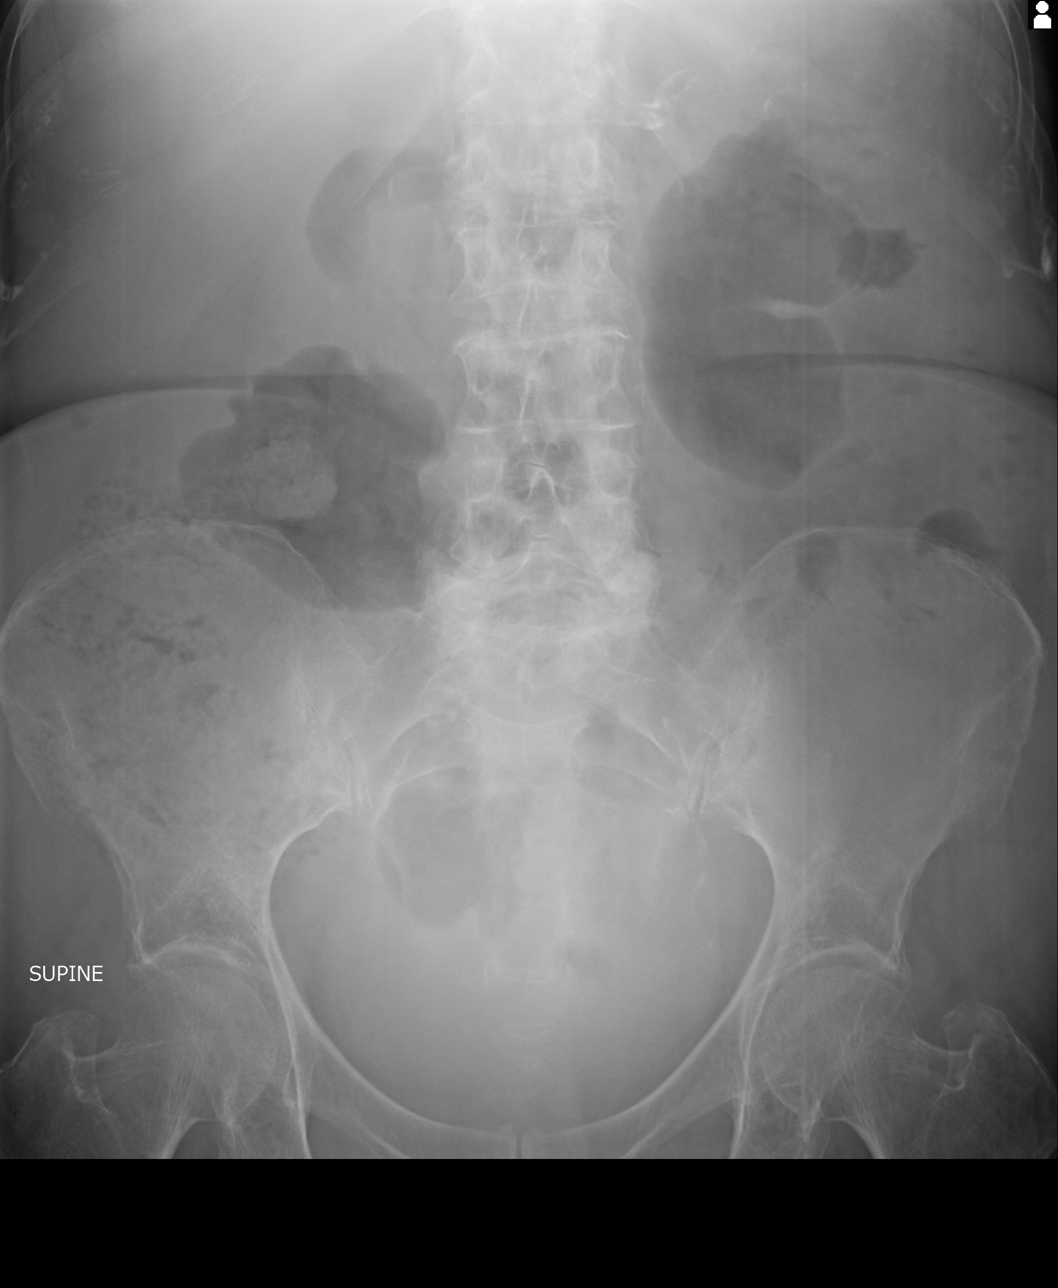
[im 3/4]
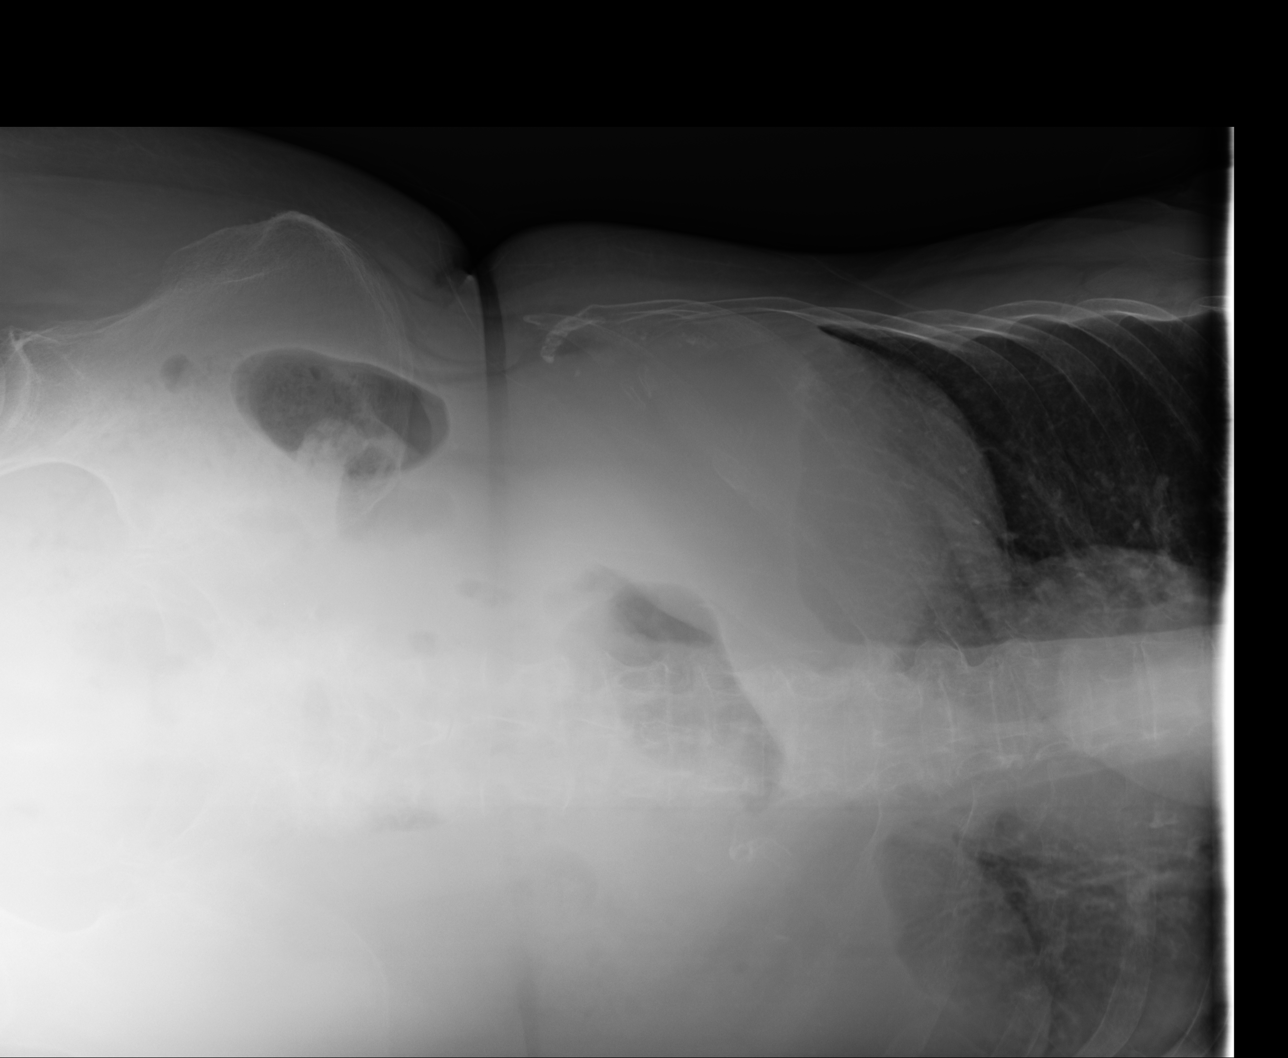
[im 4/4]
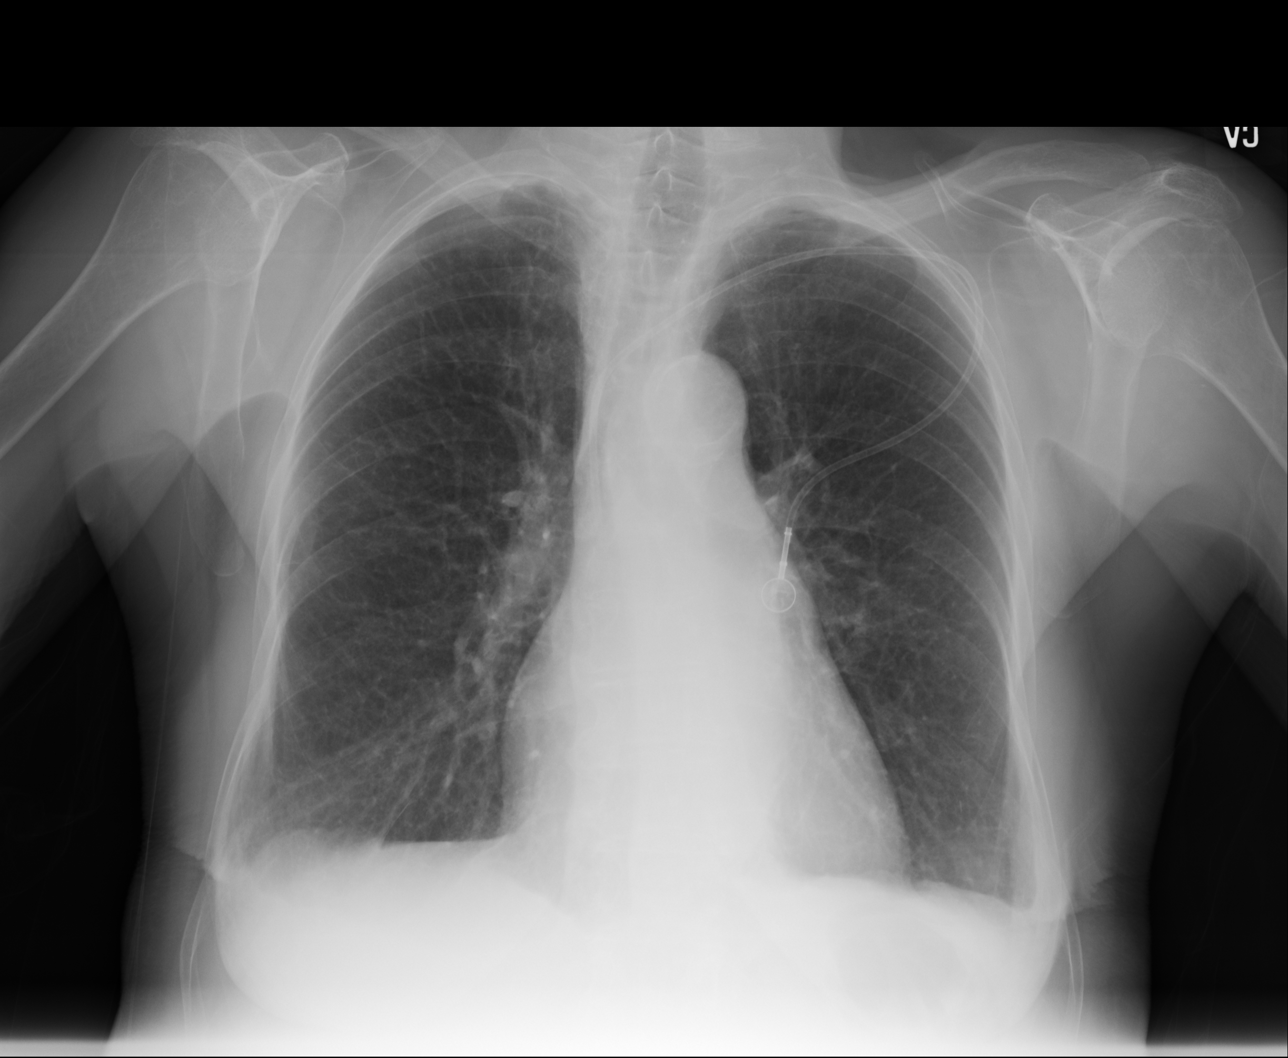

[4 of 4 positions shown; findings below may reference images not displayed]

PROCEDURE:     KDR - KDXR ABDOMEN 3-WAY(INC PA CXR)  - April 29, 2013 [DATE]

RESULT:     Three-way abdominal series shows a left-sided Port-A-Cath device
present. The tip of the catheter projects in the superior vena cava. The
lungs are hyperinflated consistent with COPD. There is evidence of lung base
fibrosis or subsegmental atelectasis on the right. There is no definite
infiltrate, effusion or pneumothorax.

There is a moderate amount of fecal material within nondistended loops of
colon. There is air in the rectosigmoid region. Degenerative changes are
seen in the spine. There is no abnormal gastric or small bowel distention
evident. There is no pneumatosis or pneumoperitoneum identified.
IMPRESSION: 1. COPD with pulmonary fibrotic changes and possibly some right lung base
atelectasis.
2. Moderate amount of fecal material in the colon. No evidence of bowel
obstruction or perforation. CT is available for further assessment if
patient has persistent symptoms. Ultrasound is also a diagnostic
consideration if indicated.

[REDACTED]

## 2014-09-10 IMAGING — CR DG ABDOMEN 3V
1 series · 5 of 5 positions shown · non-contrast
Comparison: none

REASON FOR EXAM: Obstipation and ovarian cancer
COMMENTS:   May transport without cardiac monitor

[Series 1: pa · 0.17mm/px · 5 of 5 slices shown]
[im 1/5]
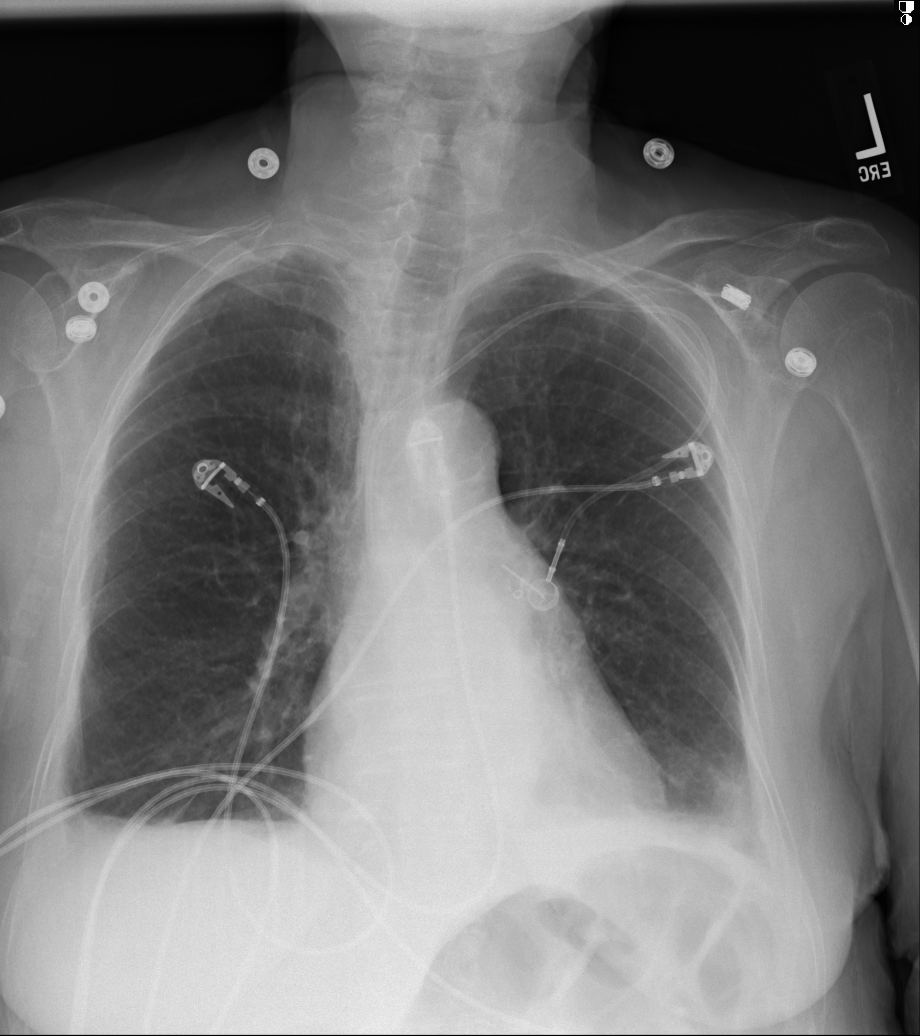
[im 2/5]
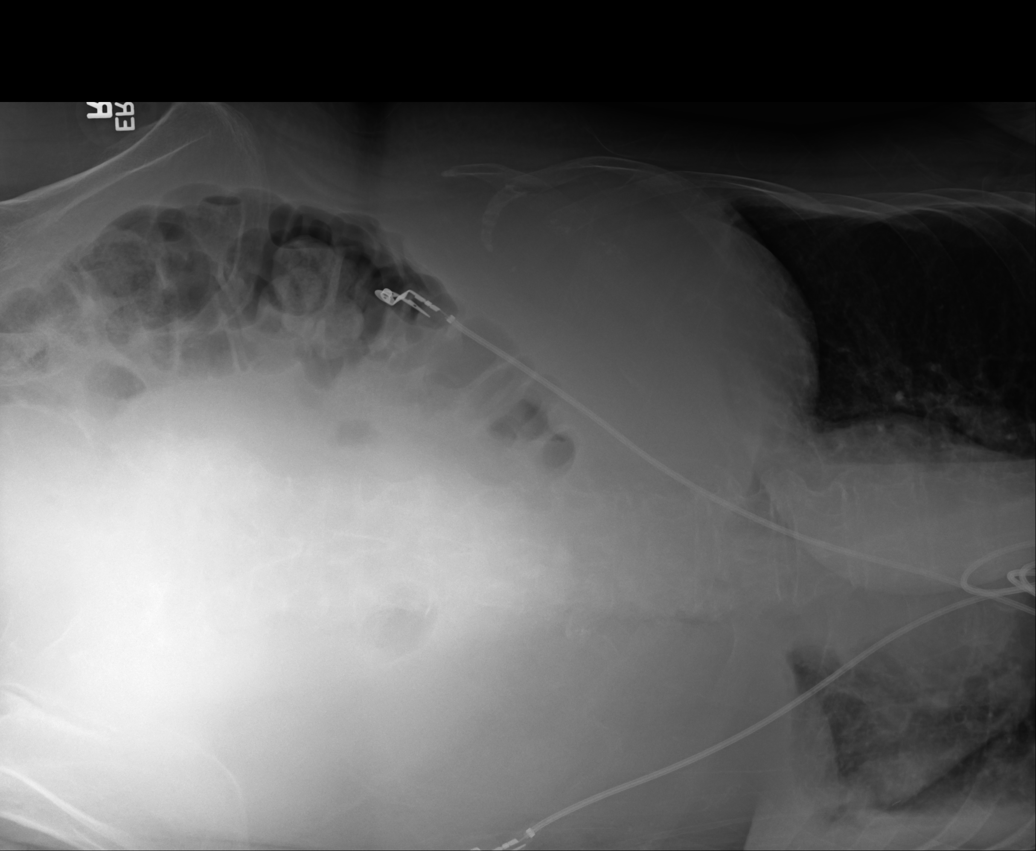
[im 3/5]
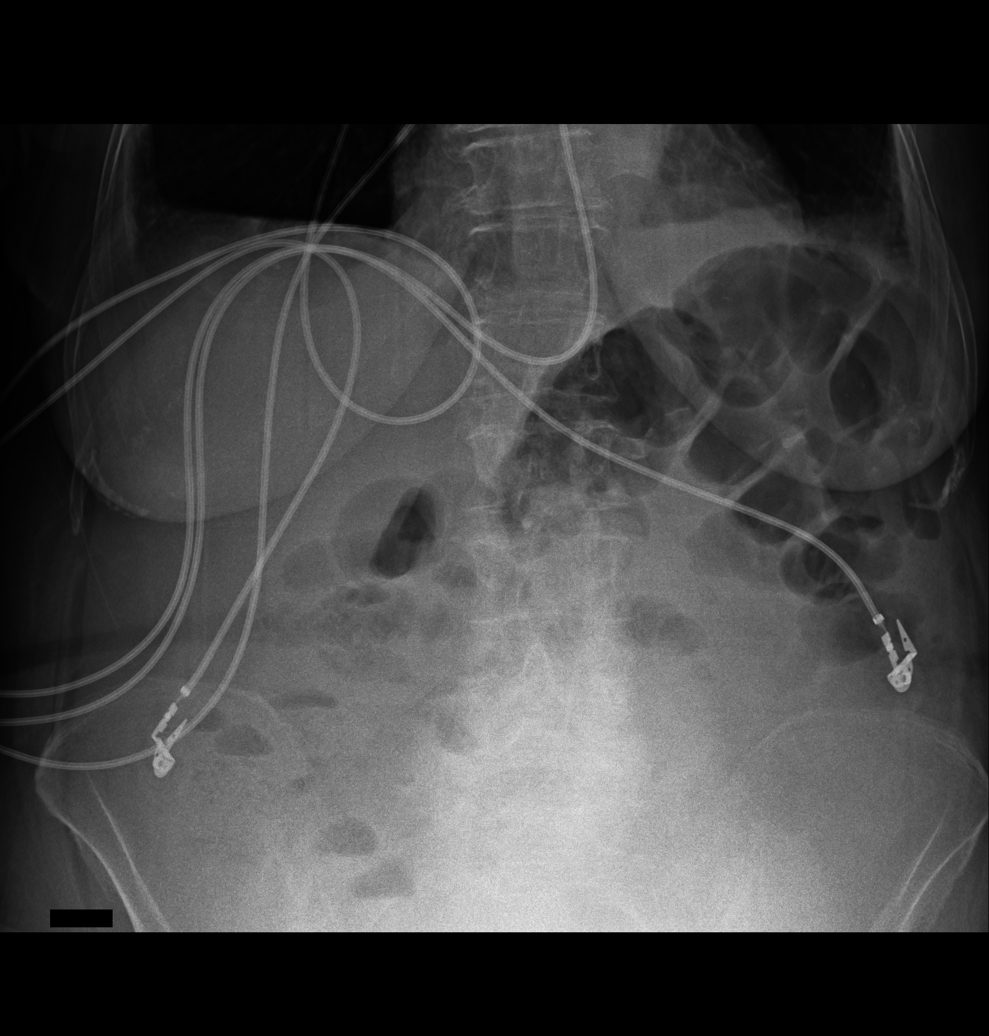
[im 4/5]
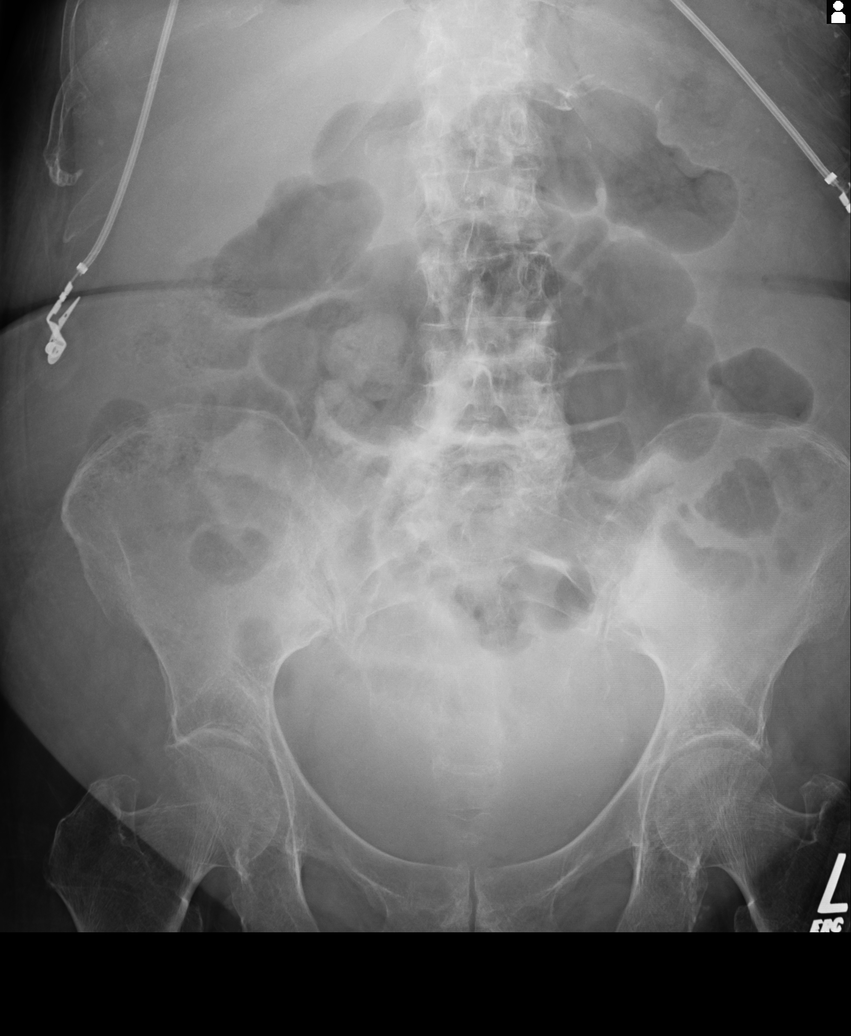
[im 5/5]
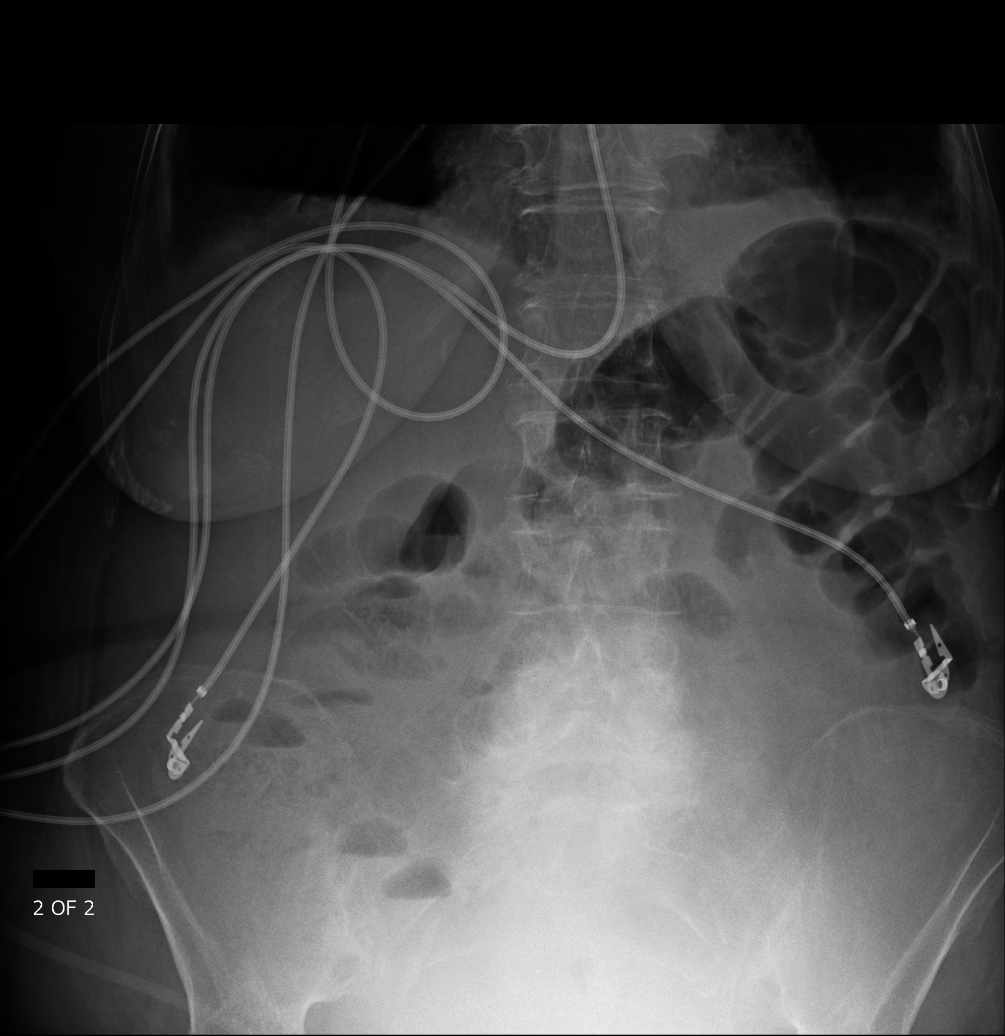

[5 of 5 positions shown; findings below may reference images not displayed]

PROCEDURE:     DXR - DXR ABDOMEN 3-WAY (INCL PA CXR)  - May 01, 2013  [DATE]

RESULT:     The lungs are hyperinflated. There is blunting of the
costophrenic angle suggestive of trace pleural effusions or pleural
thickening. There is a Port-A-Cath device over the left chest via the left
subclavian with the tip in the superior vena cava. Cardiac silhouette is
normal. The lungs are hyperinflated consistent with COPD with evidence of
interstitial fibrosis. There is air within loops of colon with fecal
material also present. No free air is evident. There does not appear to be
definite abnormal bowel distention. It would be difficult to exclude some
air within loops of small bowel in the mid abdominal region.
IMPRESSION: Nonspecific bowel gas pattern. No definite obstruction.
Ileus is not excluded. COPD is present. There is a pleural thickening versus
trace pleural effusions.

[REDACTED]

## 2014-09-12 IMAGING — CT CT ABD-PELV W/ CM
1 of 3 series · 13 of 32 positions shown, 18 images · non-contrast
Comparison: none

REASON FOR EXAM: (1) ca ovary abdominal distention; (2) ca ovary
COMMENTS:

[Series 2: 3mm soft tissue · axial · 0.68mm/px · z∈[-306,+78]mm · 13 of 145 slices shown, 18 images]
[im 9/145  soft-tissue]
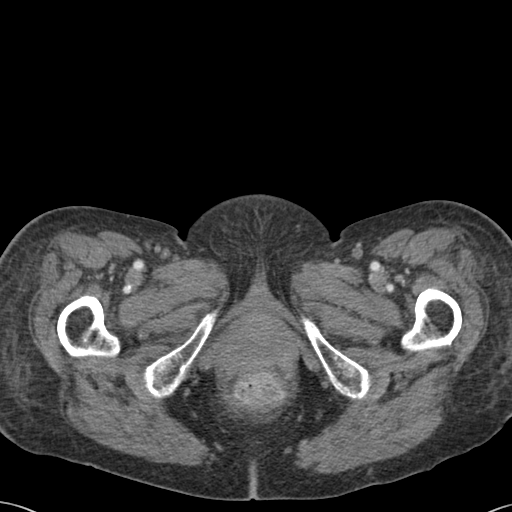
[im 9/145  bone]
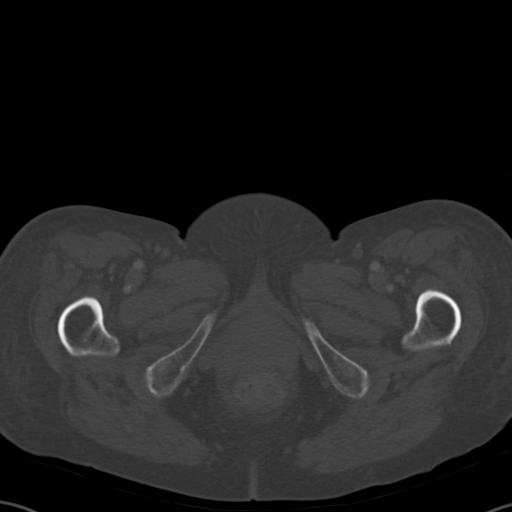
[im 25/145  soft-tissue]
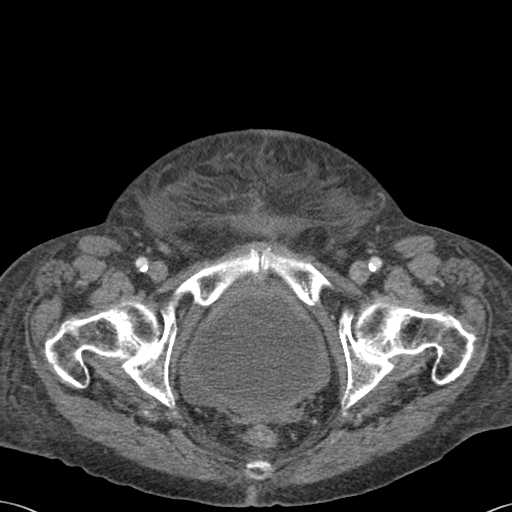
[im 33/145  soft-tissue]
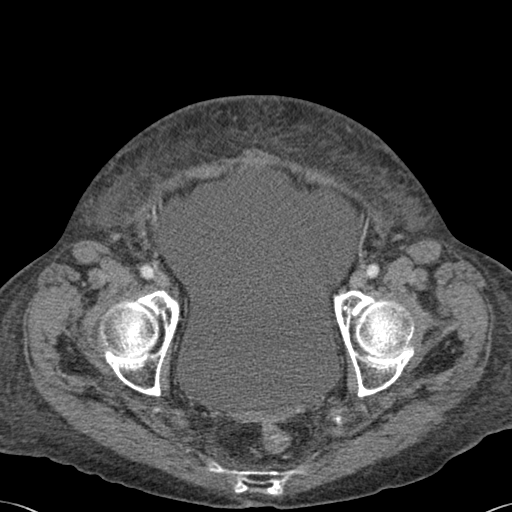
[im 41/145  soft-tissue]
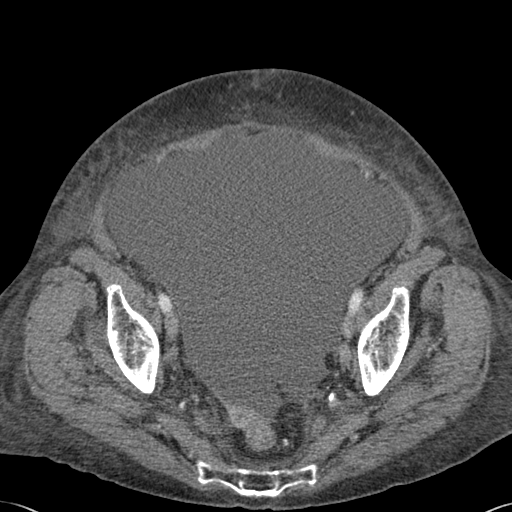
[im 57/145  soft-tissue]
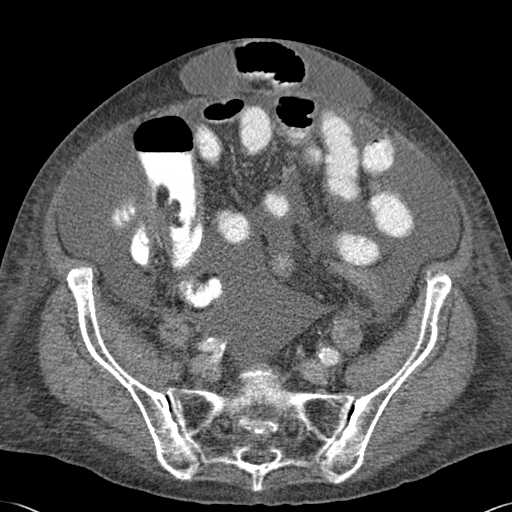
[im 65/145  soft-tissue]
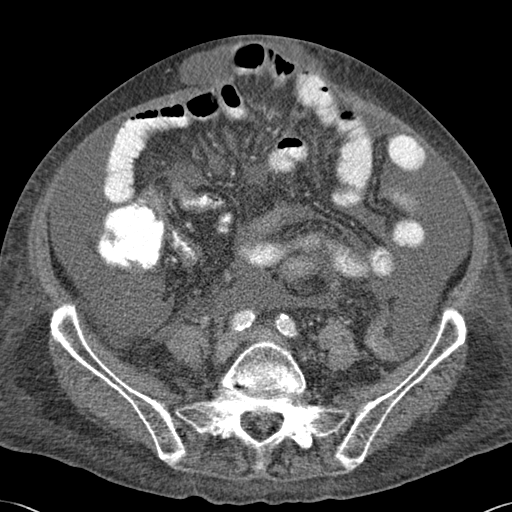
[im 81/145  soft-tissue]
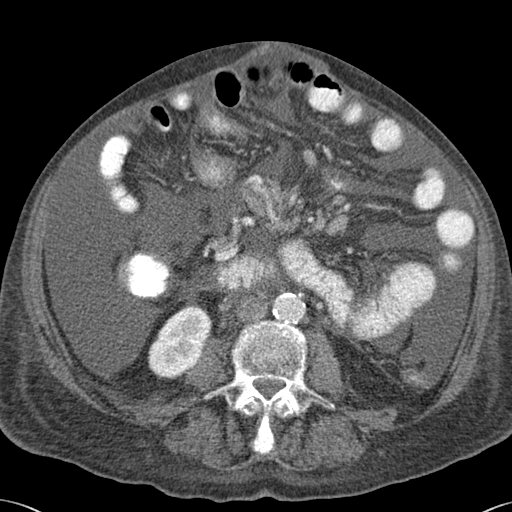
[im 89/145  soft-tissue]
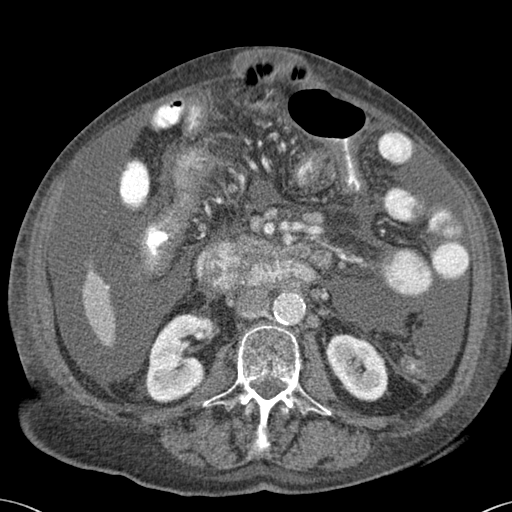
[im 105/145  soft-tissue]
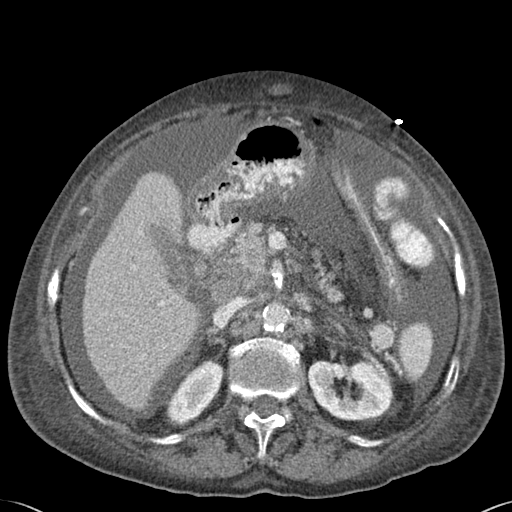
[im 105/145  bone]
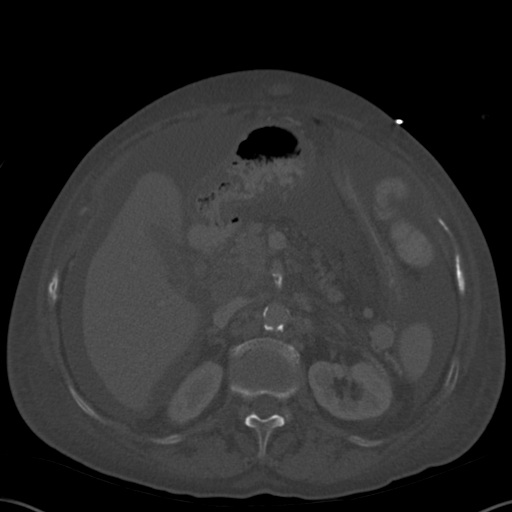
[im 113/145  soft-tissue]
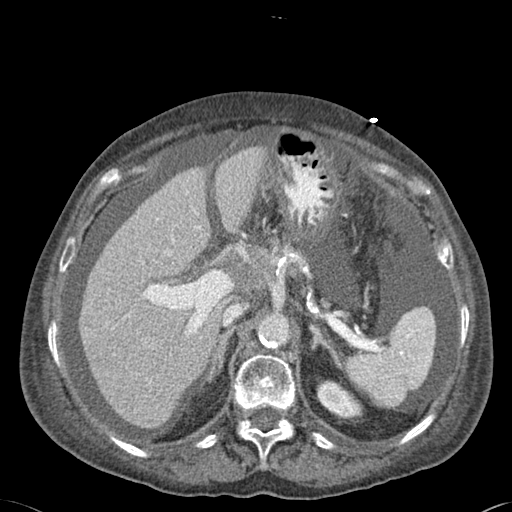
[im 113/145  lung]
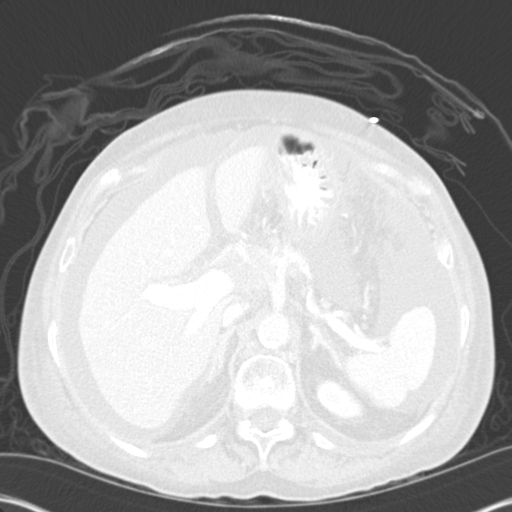
[im 121/145  soft-tissue]
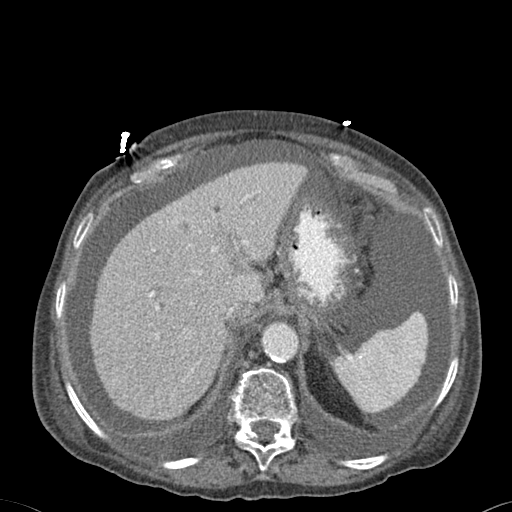
[im 121/145  lung]
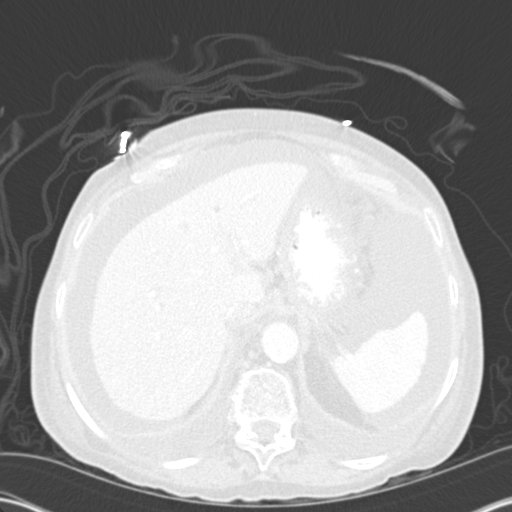
[im 129/145  lung]
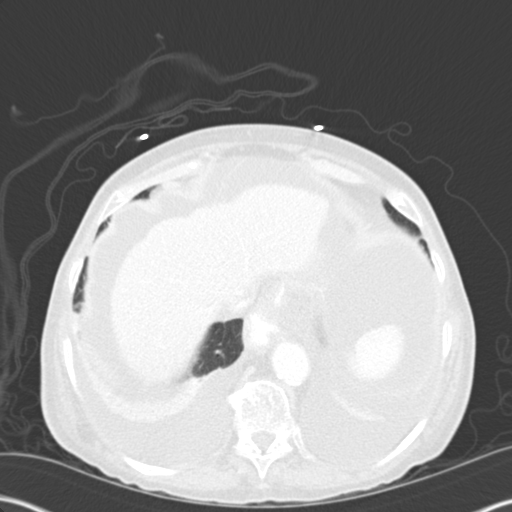
[im 137/145  soft-tissue]
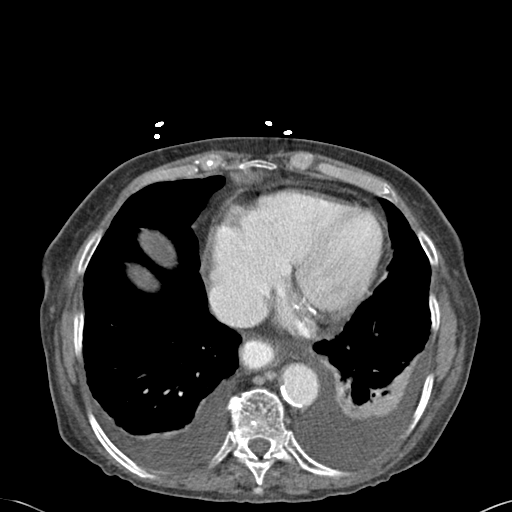
[im 137/145  lung]
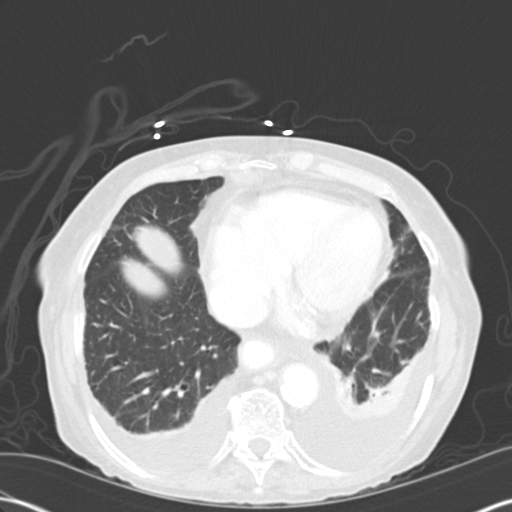

[13 of 32 positions shown; findings below may reference images not displayed]

PROCEDURE:     CT  - CT ABDOMEN / PELVIS  W  - May 03, 2013 [DATE]

RESULT:     Axial CT scanning was performed through the abdomen and pelvis
with reconstructions at 3 mm intervals and slice thicknesses. The patient
received 100 cc of Ksovue-SRE and also received oral contrast material.
Comparison is made to study October 07, 2012.

A large amount of ascites is now present. There are moderate sized bilateral
pleural effusions layering posteriorly which have increased in size. The
cardiac chambers are mildly enlarged though this is not a new finding.

Within the liver multiple hypodensities of varying sizes are present
consistent with widespread metastatic disease. The gallbladder is mildly
distended. The pancreatic head exhibits low density diffusely in the common
bile duct is dilated within it. This is not entirely new but is more
conspicuous than in the past. The spleen is not enlarged. There are no
adrenal masses. The kidneys exhibit no evidence of obstruction.

The stomach is partially distended with contrast. The orally administered
contrast has traversed the stomach and small bowel. There are loops of
mildly distended bowel within a fairly narrow necked ventral hernia and
distal to this the small bowel is not as distended. Contrast has reached the
colon which is relatively collapsed. The urinary bladder is partially
distended. A large amount of ascites is present in the lower abdomen and in
the pelvis. Enlarged lymph nodes are noted in the periaortic and pericaval
regions which are not as conspicuous as in [REDACTED]. The caliber of the
abdominal aorta exhibits failure to taper but no aneurysm. On delayed images
contrast is present within the renal collecting systems and proximal ureters.
IMPRESSION: 1. There is an abnormal appearance of small bowel loops suggesting a partial
obstruction which may be related to the ventral hernia. Distal to this the
small bowel loops are not as distended and contrast has passed into the
colon. The colon is relatively collapsed.
2. There is low density within as well as poor definition of the mildly
enlarged pancreatic head. The gallbladder is mildly distended. Multiple
masses within the liver are compatible with metastatic disease. A few
enlarged retroperitoneal lymph nodes are present in the periaortic and
pericaval regions.
3. The volume of ascites has increased since [REDACTED], and the bilateral
pleural effusions have increased in size.

[REDACTED]

## 2015-03-24 NOTE — Consult Note (Signed)
PATIENT NAME:  Nicole Andrews, Nicole Andrews I MR#:  [id number] DATE OF BIRTH:  10/14/1933  DATE OF CONSULTATION:  04/30/2013  REFERRING PHYSICIAN:   CONSULTING PHYSICIAN:  S.G. Jamal Collin, MD  HISTORY: This is a 81 year old female with known ovarian carcinoma who underwent surgery  followed by chemotherapy and has had recurrent disease with rising CA-125.  The patient is continued on chemotherapy. She states that usually after every treatment she gets a little bit of a problem with her bowels, but her problems currently started about a week ago with some dry heaves which progressed to a point of significant discomfort and tightness in her abdomen accompanied by some vomiting in the last 48 hours before she was admitted yesterday afternoon. The patient is aware of the fact that she has an incisional hernia, but other than the fact that it gets a little bit firm at times, when it is filled with stool, she does not notice any other issues. Further details are noted in the admitting H and P. Past history and other pertinent information has already been reviewed in the H and P and this was reviewed by myself also.  PHYSICAL EXAMINATION:  GENERAL: The patient is alert and oriented. She is not in any acute distress.  HEENT:  Her conjunctiva is pale. Tongue is moist, pink and clear.  NECK:  Is supple.  No nodes or masses palpable.  LUNGS: Clear to auscultation and percussion.  HEART: In sinus rhythm without murmurs.  ABDOMEN:  Is mildly distended but soft. Active bowel sounds are noted. No rushes or metallic sounds are noted. She has 2 incisional hernias, 1 above the umbilicus which is smaller and 1 below the umbilicus which is larger. An actual fascial defect is not easy to determine, but appears to be broad based. It also appears that the hernia is fairly close to the skin region with bowel peristalsis noted within this. Both these hernias are easily reduced. The patient had 3-way abdomen done yesterday which was reviewed  showing evidence of a large amount of stool in the left colon with some air and stool in the rectal area. No apparent signs of obstruction, based on the study.   IMPRESSION: Clinical findings suggests a moderate amount of fecal material in the colon that would likely account for her discomfort and some of the dry heaves. There is no suggestion of any involvement of the hernia as a cause of obstruction, and it did not appear that there are any other potential areas of obstruction at this point.   RECOMMENDATIONS: Continued use of enemas until her bowels return to normal. May want to consider use of MiraLax on a daily basis. If the patient remains symptomatic over the next 3 to 4 days, a CT scan may be appropriate.  At this particular point, there is no reason for the patient to remain n.p.o., but she can certainly start off on a liquid diet. I thank you for allowing me to evaluate and help in the care of this patient and will follow up on an as-needed basis.  ____________________________ S.Robinette Haines, MD sgs:sb D: 04/30/2013 14:13:10 ET T: 04/30/2013 14:26:01 ET JOB#: [id number]  cc: S.G. Jamal Collin, MD, <Dictator> [facility] Robinette Haines MD ELECTRONICALLY SIGNED 04/30/2013 17:08

## 2015-03-24 NOTE — Discharge Summary (Signed)
Dates of Admission and Diagnosis:  Date of Admission 16-Jul-2013   Date of Discharge 21-Jul-2013   Admitting Diagnosis Altered mental status   Final Diagnosis Altered  mental status due to hyponatremia   Discharge Diagnosis 1 Hyponatraemia  due to progressing  ovarian  and cancer   2 Dehydration   3 Malnutrition due to poor oral intake secondary to progressing abdominal malignancy    Chief Complaint/History of Present Illness Diagnosis: ?? Chief Complaint/Diagnosis  1. 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 2.progressing disease on GOG trial taking Femara (May of 2012) 3.progressing disease now on CT scan.  Femara was discontinued in the beginning of September 2 012 4.starting GOG protocol.  Doxil plus minus investigational agentVTX October 2 012 ?? Date of Diagnosis 06-Mar-2010 ?? HPI  patient was admitted to Hospital with confusion, lethargic, nausea vomiting dehydration and hyponatremia. Patient had recurrent and progressive ovarian cancer.  Was brought to emergency room with progressive decline. Reevaluation patient was found to be dehydrated and had hyponatremia Has a history of atrial fibrillation Poor appetite.  Declining condition   Routine Chem:  19-Aug-14 05:38   Glucose, Serum 81  Creatinine (comp) 0.92  Sodium, Serum  127  Potassium, Serum 3.8  Chloride, Serum  96  CO2, Serum 27  Calcium (Total), Serum  7.4  Anion Gap  4  Osmolality (calc) 255  eGFR (African American) >60  eGFR (Non-African American) >60 (eGFR values <71m/min/1.73 m2 may be an indication of chronic kidney disease (CKD). Calculated eGFR is useful in patients with stable renal function. The eGFR calculation will not be reliable in acutely ill patients when serum creatinine is changing rapidly. It is not useful in  patients on dialysis. The eGFR calculation may not be applicable to patients at the low and high extremes of body sizes,  pregnant women, and vegetarians.)   PERTINENT RADIOLOGY STUDIES: XRay:    24-Jun-14 14:47, Abdomen Flat and Erect  Abdomen Flat and Erect   REASON FOR EXAM:    nausea, vomiting, ovarian ca  COMMENTS:       PROCEDURE: DXR - DXR ABDOMEN 2 V FLAT AND ERECT  - May 25 2013  2:47PM     RESULT: Abdominal images demonstrate a moderate amount of air in the   stomach. There is air in loops of colon and small bowel without abnormal   distention, free air or pneumatosis. The lung bases show some minimal   atelectasis bilaterally.    IMPRESSION:  No definite changes of bowel obstruction or perforation. If   there is continued concern further investigation with CT of the abdomen   and pelvis would be recommended.    Dictation Site: 2    Verified By: GSundra Aland M.D., MD    15-Aug-14 13:35, Chest Portable Single View  Chest Portable Single View   REASON FOR EXAM:    syncope x 2, weakness  COMMENTS:       PROCEDURE: DXR - DXR PORTABLE CHEST SINGLE VIEW  - Jul 16 2013  1:35PM     RESULT: Comparison is made to a study of Apr 09, 2010.    The lungs are adequately inflated and clear. The hemidiaphragms however   partially obscured. The cardiac silhouette is top normal in size. The   central pulmonary vascularity is minimally prominent though stable. A   Port-A-Cath appliance is in place.    IMPRESSION:  There is hazy density at the lung bases which may reflect  subsegmental atelectasis. A followup PA and lateral chest x-ray would be   of value when the patient can tolerate the procedure.   Dictation Site: 1        Verified By: DAVID A. Martinique, M.D., MD  LabUnknown:    24-Jun-14 14:47, Abdomen Flat and Erect  PACS Image     15-Aug-14 11:54, CT Head Without Contrast  PACS Image     15-Aug-14 13:35, Chest Portable Single View  PACS Image   CT:    15-Aug-14 11:54, CT Head Without Contrast  CT Head Without Contrast   REASON FOR EXAM:    Weakness  COMMENTS:       PROCEDURE:  CT  - CT HEAD WITHOUT CONTRAST  - Jul 16 2013 11:54AM     RESULT: Axial noncontrast CT scanning was performed through the brain   with reconstructions at 5 mm intervals and slice thicknesses. There are   no previous studies for comparison.    There is mild age-appropriate diffuse cerebral and cerebellar atrophy   with compensatory ventriculomegaly. There is no evidence of an acute   intracranial hemorrhage nor of an evolving ischemic infarction. The   cerebellum and brainstem are normal in appearance.    At bone window settings the observed portions of the paranasal sinuses     and mastoid air cells are clear. There is no evidence of a lytic nor   blastic lesion.    IMPRESSION:   1. There is no evidence of an acute ischemic or hemorrhagic infarction.  2. There is no intracranial mass effect or hydrocephalus.  3. There is no evidence of metastatic disease to the calvarium. No acute   sinus inflammation is demonstrated.     Dictation Site: 1        Verified By: DAVID A. Martinique, M.D., [place] [facility] Course Patient was admitted in the hospital   through  emergency room because of ordered mental status.  CT scan of the brain was reported to be negative.  Patient had hyponatremia.  Dehydration.  During hospital stay sodium was corrected to level of 127.  Patient's condition continued to decline because of progressive malignancy.  Very limited options of chemotherapy now available and that has been discussed with the patient and family.  Was given options of hospice home versus home with hospice.  Patient and family wanted to go to home for end-of-life care.   Condition on Discharge Critical   DISCHARGE INSTRUCTIONS HOME MEDS:  Medication Reconciliation: Patient's Home Medications at Discharge:     Medication Instructions  spironolactone 50 mg oral tablet  1 tab(s) orally once a day   torsemide 20 mg oral tablet  2 cap(s) orally 2 times a day   ondansetron 4 mg  tablet  1 tab(s) orally every 8 hours, As Needed - for Nausea, Vomiting    acetaminophen 325 mg oral tablet  2 tab(s) orally every 4 hours   metoprolol succinate 25 mg oral tablet, extended release  1 tab(s) orally once a day   docusate sodium 100 mg oral capsule  1 cap(s) orally 2 times a day   levothyroxine 100 mcg (0.1 mg) oral tablet  1 tab(s) orally once a day   amiodarone 200 mg oral tablet  1 tab(s) orally once a day   oxycodone 5 mg oral tablet  1 tablet orally every 4 hours as needed for pain. PATIENT.     Physician's Instructions:  Home Health? No   Treatments  None   Home Oxygen? No   Diet Regular   Dietary Supplements Ensure   Dietary Supplements Frequency Once a day   Activity Limitations None   Referrals hospice   Return to Work Not Applicable   Time frame for Follow Up Appointment as needed   Electronic Signatures: Silvia Hightower, Martie Lee (MD)  (Signed 03-Sep-14 13:37)  Authored: ADMISSION DATE AND DIAGNOSIS, CHIEF COMPLAINT/HPI, PERTINENT LABS, [place], PATIENT INSTRUCTIONS   Last Updated: 03-Sep-14 13:37 by Jobe Gibbon (MD)

## 2015-03-24 NOTE — Discharge Summary (Signed)
Dates of Admission and Diagnosis:  Date of Admission 29-Apr-2013   Date of Discharge 10-May-2013   Admitting Diagnosis Nausea and vomiting   Final Diagnosis Nausea or vomiting secondary to small bowel obstruction   Discharge Diagnosis 1 Small bowel obstruction secondary to adhesions and recurrent ovARIAN  and cancer   2 Incisional hernia   3 Atrial fibrillation S/P evaluation by cardiologist Dr. Gilmer Mor   [place]   5 Status post exploratory laparotomy and lysis of adhesions and a hernia repair by Dr. Jamal Collin    Chief Complaint/History of Present Illness Diagnosis: ?? Chief Complaint/Diagnosis  1. 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 2. Progressing disease on GOG trial taking Femara (May of 2012) 3. Pogressing disease now on CT scan.  Femara was discontinued in the beginning of September 2 012 4. Sarted GOG protocol.  Doxil plus minus investigational agent VTX October 2012 tolerating treatment well  5. Progressing disease on Doxil and   VTX  by CT scan as well as by tumor markers in May of 2013 6. Started on topotecan for ovarian cancer in June of 2013 7. Tumor markers and CT scan shows progressive disease.  Patient has been started on cis-platinum and gemcitabine from November of 8060  81 year old lady with recurrent and progressive carcinoma  of ovary was seen by primary care physician today.she started having abdominal distention and pain nausea vomiting since yesterday.  Tenderness and abdominal area.  Patient admits she the abdomen x-ray which include stool in the colon but no evidence of obstruction.  Patient was then brought for my evaluation referred by primary care  physician .Patient was in mild distress because of abdominal pain and nausea and vomiting.  Has low-grade fever.   Hepatic:  08-Jun-14 04:44   Bilirubin, Total 0.4  Alkaline Phosphatase 101  SGPT (ALT)  11  SGOT (AST) 30  Total Protein,  Serum  4.8  Albumin, Serum  1.5  Routine Chem:  08-Jun-14 04:44   Glucose, Serum 93  BUN 9  Creatinine (comp) 0.74  Sodium, Serum  126  Potassium, Serum  3.3  Chloride, Serum  92  CO2, Serum 26  Calcium (Total), Serum  7.6  Anion Gap 8  Osmolality (calc) 252  eGFR (African American) >60  eGFR (Non-African American) >60 (eGFR values <55m/min/1.73 m2 may be an indication of chronic kidney disease (CKD). Calculated eGFR is useful in patients with stable renal function. The eGFR calculation will not be reliable in acutely ill patients when serum creatinine is changing rapidly. It is not useful in  patients on dialysis. The eGFR calculation may not be applicable to patients at the low and high extremes of body sizes, pregnant women, and vegetarians.)  Result Comment LABS - This specimen was collected through an   - indwelling catheter or arterial line.  - A minimum of 56m of blood was wasted prior    - to collecting the sample.  Interpret  - results with caution.  Result(s) reported on 09 May 2013 at 05:32AM.  Magnesium, Serum 2.1 (1.8-2.4 THERAPEUTIC RANGE: 4-7 mg/dL TOXIC: > 10 mg/dL  -----------------------)  09-Jun-14 04:53   Glucose, Serum 85  BUN 9  Creatinine (comp)  0.59  Sodium, Serum  129  Potassium, Serum  3.2  Chloride, Serum  94  CO2, Serum 27  Calcium (Total), Serum  7.5  Anion Gap 8  Osmolality (calc) 257  eGFR (African American) >60  eGFR (Non-African American) >60 (eGFR values <6048min/1.73  m2 may be an indication of chronic kidney disease (CKD). Calculated eGFR is useful in patients with stable renal function. The eGFR calculation will not be reliable in acutely ill patients when serum creatinine is changing rapidly. It is not useful in  patients on dialysis. The eGFR calculation may not be applicable to patients at the low and high extremes of body sizes, pregnant women, and vegetarians.)  Result Comment LABS - This specimen was collected through  an   - indwelling catheter or arterial line.  - A minimum of 18ms of blood was wasted prior    - to collecting the sample.  Interpret  - results with caution.  Result(s) reported on 10 May 2013 at 05:09AM.  Routine Hem:  08-Jun-14 04:44   WBC (CBC) 6.8  RBC (CBC)  2.97  Hemoglobin (CBC)  10.2  Hematocrit (CBC)  29.3  Platelet Count (CBC) 320  MCV 99  MCH  34.5  MCHC 35.0  RDW  15.7  Neutrophil % 82.6  Lymphocyte % 6.6  Monocyte % 7.7  Eosinophil % 2.2  Basophil % 0.9  Neutrophil # 5.6  Lymphocyte #  0.5  Monocyte # 0.5  Eosinophil # 0.1  Basophil # 0.1   PERTINENT RADIOLOGY STUDIES: XRay:    31-May-14 21:45, Abdomen 3 Way Includes PA Chest  Abdomen 3 Way Includes PA Chest   REASON FOR EXAM:    Obstipation and ovarian cancer  COMMENTS:   May transport without cardiac monitor    PROCEDURE: DXR - DXR ABDOMEN 3-WAY (INCL PA CXR)  - May 01 2013  9:45PM     RESULT: The lungs are hyperinflated. There is blunting of the   costophrenic angle suggestive of trace pleural effusions or pleural   thickening. There is a Port-A-Cath device over the left chest via the   left subclavian with the tip in the superior vena cava. Cardiac   silhouette is normal. The lungs are hyperinflated consistent with COPD   with evidence of interstitial fibrosis. There is air within loops of   colon with fecal material also present. No free air is evident. There   does not appear to be definite abnormal bowel distention. It would be   difficult to exclude some air within loops of small bowel in the mid   abdominal region.  IMPRESSION:  Nonspecific bowel gas pattern. No definite obstruction.   Ileus is not excluded. COPD is present. There is a pleural thickening   versus trace pleural effusions.    Dictation Site: 6        Verified By: GSundra Aland M.D., MD  LabUnknown:  PACS Image     02-Jun-14 12:36, CT Abdomen and Pelvis With Contrast  PACS Image   CT:  CT Abdomen and Pelvis With  Contrast   REASON FOR EXAM:    (1) ca ovary abdominal distention; (2) ca ovary  COMMENTS:       PROCEDURE: CT  - CT ABDOMEN / PELVIS  W  - May 03 2013 12:36PM     RESULT: Axial CT scanning was performed through the abdomen and pelvis   with reconstructions at 355mintervals and slice thicknesses. The patient   received 100 cc of Isovue-370 and also received oral contrast material.   Comparison is made to study of October 07, 2012.    A large amount of ascites is now present. There are moderate sized   bilateral pleural effusions layering posteriorly which have increased in   size. The  cardiac chambers are mildly enlarged though this is not a new   finding.  Within the liver multiple hypodensities of varying sizes are present   consistent with widespreadmetastatic disease. The gallbladder is mildly   distended. The pancreatic head exhibits low density diffusely in the   common bile duct is dilated within it. This is not entirely new but is   more conspicuous than in the past. The spleen is not enlarged. There are   no adrenal masses. The kidneys exhibit no evidence of obstruction.    The stomach is partially distended with contrast. The orally administered   contrast has traversed the stomach and small bowel. There are loops of   mildly distended bowel within a fairly narrow necked ventral hernia and   distal to this the small bowel is not as distended. Contrast has reached   the colon which is relatively collapsed. The urinary bladder is partially   distended. A large amount of ascites is present in the lower abdomen and   in the pelvis. Enlarged lymph nodes are noted in the periaortic and   pericaval regions which are not as conspicuous as in November. The     caliber of the abdominal aorta exhibits failure to taper but no aneurysm.   On delayed images contrast is present within the renal collecting systems   and proximal ureters.    IMPRESSION:   1. There is an abnormal  appearance of small bowel loops suggesting a   partial obstruction which may be related to the ventral hernia. Distal to   this the small bowel loops are not as distended and contrast has passed   into the colon. The colon is relatively collapsed.  2. There is low density within as well as poor definition of the mildly   enlarged pancreatic head. The gallbladder is mildly distended. Multiple   masses within the liver are compatible with metastatic disease. A few   enlarged retroperitoneal lymph nodes are present in the periaortic and   pericaval regions.  3. The volume of ascites has increased since November, and the bilateral     pleural effusions have increased in size.     Dictation Site: 1        Verified By: DAVID A. Martinique, M.D., [facility] Patient continued to have abdominal distention.  Patient was evaluated by Dr. Jamal Collin.  CT scan was done.  Small bowel obstruction was suspected.  Patient underwent exported laparotomy after fading conservative management of small bowel obstruction.  Lysis of adhesions.  Patient also had a repair of incisional hernia Bases Gen. condition gradually improved.  Physiotherapy and occupational therapy was ordered.  Patient made good progress on physiotherapy Diet was gradually advanced FOR  atrial fibrillation cardiology consult was obtained.. metoprolol was started.   SYNTHROID  was continued   Total time spent in for discharge management coordinating care with differing condition, all healthcare and talking with the patient and family leave for more than 40 minutes   Condition on Discharge Stable   DISCHARGE INSTRUCTIONS HOME MEDS:  Medication Reconciliation: Patient's Home Medications at Discharge:     Medication Instructions  aspirin 81 mg enteric coated tablet  1 tab(s) orally once a day (in the morning)    vitamin e 400 intl units capsule  1 cap(s) orally once a day    synthroid 100 mcg  (0.1 mg) tablet  1 tab(s) orally once a day (in the morning)  pravastatin 40 mg tablet  1 tab(s) orally once a day (at bedtime)    furosemide 20 mg tablet  1 tab(s) orally once a day, As Needed for fluid build-up   colace 50 mg oral capsule  1 cap(s) orally once a day, As Needed - for Constipation   ondansetron 4 mg tablet  1 tab(s) orally every 8 hours, As Needed - for Nausea, Vomiting    os-cal calcium+d3 500 mg-200 intl units oral tablet  1 tab(s) orally once a day   metoprolol tartrate 25 mg oral tablet  1 tab(s) orally 2 times a day     Physician's Instructions:  Home Health? Yes   [facility] [facility]  Occupational Therapy   Treatments None   Home Oxygen? No   Diet Regular   Diet Consistency Regular Consistency   Activity Limitations None   Return to Work Not Applicable   Time frame for Follow Up Appointment 2-4 weeks   Other Comments SEE ME NEXT WEEK SEE DR Jamal Collin IN 2 WEEKS   Electronic Signatures: Jobe Gibbon (MD)  (Signed 09-Jun-14 17:01)  Authored: ADMISSION DATE AND DIAGNOSIS, CHIEF COMPLAINT/HPI, PERTINENT LABS, PERTINENT RADIOLOGY STUDIES, HOSPITAL COURSE, DISCHARGE INSTRUCTIONS HOME MEDS, PATIENT INSTRUCTIONS   Last Updated: 09-Jun-14 17:01 by Jobe Gibbon (MD)

## 2015-03-24 NOTE — Op Note (Signed)
PATIENT NAME:  Nicole Andrews, Nicole Andrews MR#:  [id number] DATE OF BIRTH:  [birth year]  DATE OF PROCEDURE:  05/05/2013  PREOPERATIVE DIAGNOSES:  1.  Incisional hernia and a small bowel obstruction.   2.  Ascites and history of ovarian carcinoma.   POSTOPERATIVE DIAGNOSES:   1.  Incisional hernia and a small bowel obstruction.  2.  Ascites and history of ovarian carcinoma.  OPERATION PERFORMED:  Laparoscopy and drainage of ascites and repair incisional hernia with mesh.   SURGEON:  Mckinley Jewel, M.D.   ANESTHESIA:  General.   COMPLICATIONS:  None.   ESTIMATED BLOOD LOSS:  Minimal.   DRAINS:  None.   DESCRIPTION OF PROCEDURE:  The patient was put to sleep in the supine position on the operating table.  A Foley catheter was inserted and the abdomen was prepped and draped out a sterile field.  The patient had a moderate amount of ascites as noted on a CT.  After timeout procedure, a small incision was made below the left costal margin through which a Veress needle was introduced and fluid was easily withdrawn.  Through this side a 10 mm port was placed and a camera was introduced.  A 5 mm port was placed in the left lower abdomen laterally and through this a suction device was used to drain out about 2 liters of fluid from the peritoneal cavity.  There was evidence of some peritoneal seeding along with some cirrhosal seeding to the bowel.  An additional 5 mm port was placed in the mid left lateral abdomen and with the peritoneum the abdominal cavity was more well-visualized.  A row of incisional hernia was noted in the midline extending from the level of the falciform ligament to well below the umbilicus.  No contents through this were noted at this time, however there was an adhesion of loop of small bowel which was partially kinked by this adhesion, tethered to the anterior abdominal wall just below the lowermost hernia.  With careful exposure, this was taken down, taking a part of the peritoneum with this  to prevent any injury to the bowel and this obstructing area was relieved.  The remaining fluid was then suctioned out to a total of 5 liters all told.  The patient was given an adequate amount of fluids to maintain her blood pressure throughout the time when the fluid was aspirated.  Following this, the repair of the incisional hernia was performed.  An 11 mm port was placed to the left upper quadrant site and placed the 10 mm port through which a 20 x 25 cm Physiomesh mesh was brought into the field.  It was then positioned across the abdominal wall with margins of at least 4 to 5 cm above and below the extent of the hernial defects and placed 5 cm lateral to the lateral edges.  A secure strap was then used to tack this down all around, a centimeter interval.  Following this, 5 Prolene stitches were placed through tiny stab incisions using a spinal needle to pass these and tied down, one superiorly, two inferiorly and one on each side laterally.  A satisfactory coverage of the fascial defect was noted.  After ensuring hemostasis, the ports were removed and the pneumoperitoneum was released.  The fascial opening in the larger port site in the left upper quadrant was closed with 3-0 Vicryl.  The two 5 mm port site closed with a deep subcutaneous plain with 3-0 Vicryl.  Skin incisions were then closed  with 4-0 Vicryl at the port sites and the tiny stab for these sutures on the mesh just covered with Dermabond.  The procedure was well-tolerated.  The patient subsequently was extubated and returned to the recovery room in stable condition.    ____________________________ S.Robinette Haines, MD sgs:ea D: 05/05/2013 16:17:41 ET T: 05/05/2013 19:49:33 ET JOB#: [id number]  cc: Synthia Innocent. Jamal Collin, MD, <Dictator> [facility] Robinette Haines MD ELECTRONICALLY SIGNED 05/06/2013 6:55

## 2015-03-24 NOTE — Consult Note (Signed)
General Aspect 81 year old female with chronic atrial fibrillation, remote hx of TIA in 2003, known ovarian carcinoma s/p  surgery  followed by chemotherapy and has had recurrent disease with rising CA-125,  on chemotherapy presenting with  significant discomfort and tightness in her abdomen accompanied by some vomiting. Cardiology was consulted for stat consult for preop evaluation for abn ekg. Pending surgery, laparoscopy, drainage of ascites, repair of ventral hernis and lysis of adhesion.  She has had an incisional hernia which has been chronic, in the past treated medically.   Present Illness . PFSH: ?? Family History noncontributory, positive ?? Comments Sister  with breast cancer ?? Social History negative alcohol, negative tobacco ?? Comments patient stopped smoking in 1983   Surg:  1967   hysterectomy for uterine descensus, appendectomy was done at that time as well           breast biopsy with benign results   Physical Exam:  GEN well developed, well nourished, anxious   HEENT hearing intact to voice, moist oral mucosa   NECK supple   RESP normal resp effort  clear BS  wheezing   CARD Irregular rate and rhythm  Tachycardic  No murmur   ABD denies tenderness  soft   LYMPH negative neck   EXTR negative edema   SKIN normal to palpation   NEURO motor/sensory function intact   PSYCH alert, A+O to time, place, person, good insight   Review of Systems:  Subjective/Chief Complaint cough, slight SOB, anxious   General: No Complaints   Skin: No Complaints   ENT: No Complaints   Eyes: No Complaints   Neck: No Complaints   Respiratory: Frequent cough  Sputum   Cardiovascular: Palpitations   Gastrointestinal: No Complaints   Genitourinary: ABD distention, tight   Vascular: No Complaints   Musculoskeletal: No Complaints   Neurologic: No Complaints   Hematologic: No Complaints   Endocrine: No Complaints   Psychiatric: No Complaints   Review of  Systems: All other systems were reviewed and found to be negative   Medications/Allergies Reviewed Medications/Allergies reviewed     Oncology Protocol: Patient is on Research Study, GOG 3003, Doxorubicin and [id number], Ranelle Oyster, RN   Oncology Protocol: GOG [phone], Taxol, Carboplatin, Avastin, Protocol patient, Call [phone] to speak to someone in research from 8am-5pm and on-call doctor after hours. pw.   Ovarian Cancer:    visual trouble:    bruising:    hemorrhoids:    thyroid disease:    partial hysterectomy, ovaries remain:    left breast biopsy:        Admit Diagnosis:   VOMITTING: Onset Date: 05-May-2013, Status: Active, Description: VOMITTING  Home Medications: Medication Instructions Status  amlodipine 10 mg tablet 1 tab(s) orally once a day Active  furosemide 20 mg tablet 1 tab(s) orally once a day, As Needed for fluid build-up Active  Colace 50 mg oral capsule 1 cap(s) orally once a day, As Needed - for Constipation Active  ondansetron 4 mg tablet 1 tab(s) orally every 8 hours, As Needed - for Nausea, Vomiting  Active  aspirin 81 mg enteric coated tablet 1 tab(s) orally once a day (in the morning)  Active  vitamin E 400 intl units capsule 1 cap(s) orally once a day  Active  Synthroid 100 mcg (0.1 mg) tablet 1 tab(s) orally once a day (in the morning)  Active  pravastatin 40 mg tablet 1 tab(s) orally once a day (at bedtime)  Active  Os-Cal Calcium+D3 500 mg-200  intl units oral tablet 1 tab(s) orally once a day Active   Lab Results:  Routine Chem:  03-Jun-14 05:11   Glucose, Serum  124  BUN  1  Creatinine (comp) 0.66  Sodium, Serum  129  Potassium, Serum 3.6  Chloride, Serum 100  CO2, Serum 24  Calcium (Total), Serum  7.9  Anion Gap  5  Osmolality (calc) 256  eGFR (African American) >60  eGFR (Non-African American) >60 (eGFR values <60m/min/1.73 m2 may be an indication of chronic kidney disease (CKD). Calculated eGFR is useful in patients with  stable renal function. The eGFR calculation will not be reliable in acutely ill patients when serum creatinine is changing rapidly. It is not useful in  patients on dialysis. The eGFR calculation may not be applicable to patients at the low and high extremes of body sizes, pregnant women, and vegetarians.)  Magnesium, Serum  1.5 (1.8-2.4 THERAPEUTIC RANGE: 4-7 mg/dL TOXIC: > 10 mg/dL  -----------------------)   EKG:  Interpretation EKG with atrial fibrillation rate 105 bpm, low voltage, poor R wave progression through the precordial leads, nonspecific T wave ABN in V1 to V3   Radiology Results: CT:    02-Jun-14 12:36, CT Abdomen and Pelvis With Contrast  CT Abdomen and Pelvis With Contrast   REASON FOR EXAM:    (1) ca ovary abdominal distention; (2) ca ovary  COMMENTS:       PROCEDURE: CT  - CT ABDOMEN / PELVIS  W  - May 03 2013 12:36PM     RESULT: Axial CT scanning was performed through the abdomen and pelvis   with reconstructions at 356mintervals and slice thicknesses. The patient   received 100 cc of Isovue-370 and also received oral contrast material.   Comparison is made to study of October 07, 2012.    A large amount of ascites is now present. There are moderate sized   bilateral pleural effusions layering posteriorly which have increased in   size. The cardiac chambers are mildly enlarged though this is not a new   finding.  Within the liver multiple hypodensities of varying sizes are present   consistent with widespreadmetastatic disease. The gallbladder is mildly   distended. The pancreatic head exhibits low density diffusely in the   common bile duct is dilated within it. This is not entirely new but is   more conspicuous than in the past. The spleen is not enlarged. There are   no adrenal masses. The kidneys exhibit no evidence of obstruction.    The stomach is partially distended with contrast. The orally administered   contrast has traversed the stomach and  small bowel. There are loops of   mildly distended bowel within a fairly narrow necked ventral hernia and   distal to this the small bowel is not as distended. Contrast has reached   the colon which is relatively collapsed. The urinary bladder is partially   distended. A large amount of ascites is present in the lower abdomen and   in the pelvis. Enlarged lymph nodes are noted in the periaortic and   pericaval regions which are not as conspicuous as in November. The     caliber of the abdominal aorta exhibits failure to taper but no aneurysm.   On delayed images contrast is present within the renal collecting systems   and proximal ureters.    IMPRESSION:   1. There is an abnormal appearance of small bowel loops suggesting a   partial obstruction which may be  related to the ventral hernia. Distal to   this the small bowel loops are not as distended and contrast has passed   into the colon. The colon is relatively collapsed.  2. There is low density within as well as poor definition of the mildly   enlarged pancreatic head. The gallbladder is mildly distended. Multiple   masses within the liver are compatible with metastatic disease. A few   enlarged retroperitoneal lymph nodes are present in the periaortic and   pericaval regions.  3. The volume of ascites has increased since November, and the bilateral     pleural effusions have increased in size.     Dictation Site: 1        Verified By: DAVID A. Martinique, M.D., MD    Other -Explain in Comment Field: Other, Rash  NKDA: Other  Vital Signs/Nurse's Notes: **Vital Signs.:   04-Jun-14 09:24  Vital Signs Type Routine  Temperature Temperature (F) 98.2  Celsius 36.7  Temperature Source oral  Pulse Pulse 63  Respirations Respirations 19  Systolic BP Systolic BP 161  Diastolic BP (mmHg) Diastolic BP (mmHg) 89  Mean BP 114  Pulse Ox % Pulse Ox % 95  Oxygen Delivery Room Air/ 21 %    Impression 81 year old female with chronic  atrial fibrillation, known ovarian carcinoma s/p  surgery  followed by chemotherapy and has had recurrent disease with rising CA-125,  on chemotherapy presenting with  significant discomfort and tightness in her abdomen accompanied by some vomiting. Cardiology was consulted for stat consult for preop evaluation for abn ekg. Pending surgery, laparoscopy, drainage of ascites, repair of ventral hernis and lysis of adhesion.  1) ABN ekg: nonspecific T wave ABN in V1 to V3 no prior cardiac hx.  no known CAD no anginal sx in the past --Would proceed with surgery --she has had significant IVF since arrival would drop rate of IVF down as she is in atrial fib with elevated rate --No further testingf needed  Could use diltiazem IV or metoprolol IV for rate control if needed Lasix IV for SOB, worsening cough (diastolic CHF)  2) Atrial fib: not on anticoagulation as an outpt Denies any problems with bleeding Will discuss further with her after surgery /recovery --May need rate control  meds, will monitor rate post op and in recovery.  3) Ovarian CA mamnagement per oncology/surgical service  4)HTN:  meds on hold for now Could potentially change amlodipine to cardizem if rate control needed, once taking po meds   Electronic Signatures: Ida Rogue (MD)  (Signed 04-Jun-14 10:40)  Authored: General Aspect/Present Illness, History and Physical Exam, Review of System, Past Medical History, Health Issues, Home Medications, Labs, EKG , Radiology, Allergies, Vital Signs/Nurse's Notes, Impression/Plan   Last Updated: 04-Jun-14 10:40 by Ida Rogue (MD)
